# Patient Record
Sex: Female | Born: 1985 | Race: Black or African American | Hispanic: No | Marital: Single | Smoking: Former smoker
Health system: Southern US, Community
[De-identification: ages and names within clinical notes are randomized; demographics above are authoritative.]

## PROBLEM LIST (undated history)

## (undated) DIAGNOSIS — N83202 Unspecified ovarian cyst, left side: Secondary | ICD-10-CM

## (undated) DIAGNOSIS — Z8679 Personal history of other diseases of the circulatory system: Secondary | ICD-10-CM

## (undated) DIAGNOSIS — Z87442 Personal history of urinary calculi: Secondary | ICD-10-CM

## (undated) HISTORY — PX: ECTOPIC PREGNANCY SURGERY: SHX613

## (undated) HISTORY — PX: LASER ABLATION: SHX1947

---
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
## 2006-08-09 ENCOUNTER — Emergency Department: Payer: Self-pay | Admitting: Emergency Medicine

## 2008-11-09 ENCOUNTER — Emergency Department: Payer: Self-pay | Admitting: Emergency Medicine

## 2009-10-09 ENCOUNTER — Emergency Department: Payer: Self-pay | Admitting: Emergency Medicine

## 2009-11-13 ENCOUNTER — Ambulatory Visit: Payer: Self-pay | Admitting: General Practice

## 2009-12-11 ENCOUNTER — Ambulatory Visit: Payer: Self-pay | Admitting: Urology

## 2009-12-12 ENCOUNTER — Ambulatory Visit: Payer: Self-pay | Admitting: Urology

## 2009-12-21 ENCOUNTER — Ambulatory Visit: Payer: Self-pay | Admitting: Cardiovascular Disease

## 2010-03-26 ENCOUNTER — Emergency Department: Payer: Self-pay | Admitting: Unknown Physician Specialty

## 2010-09-18 ENCOUNTER — Emergency Department: Payer: Self-pay | Admitting: Emergency Medicine

## 2011-01-02 ENCOUNTER — Emergency Department: Payer: Self-pay | Admitting: *Deleted

## 2011-08-21 ENCOUNTER — Emergency Department: Payer: Self-pay | Admitting: Emergency Medicine

## 2011-08-21 LAB — COMPREHENSIVE METABOLIC PANEL
Alkaline Phosphatase: 46 U/L — ABNORMAL LOW (ref 50–136)
Anion Gap: 7 (ref 7–16)
BUN: 11 mg/dL (ref 7–18)
Bilirubin,Total: 0.4 mg/dL (ref 0.2–1.0)
Chloride: 111 mmol/L — ABNORMAL HIGH (ref 98–107)
Creatinine: 0.82 mg/dL (ref 0.60–1.30)
EGFR (African American): >60
Osmolality: 282 (ref 275–301)
Potassium: 4.3 mmol/L (ref 3.5–5.1)
SGPT (ALT): 134 U/L — ABNORMAL HIGH
Sodium: 142 mmol/L (ref 136–145)
Total Protein: 6.9 g/dL (ref 6.4–8.2)

## 2011-08-21 LAB — TROPONIN I
Troponin-I: <0.02 ng/mL
Troponin-I: <0.02 ng/mL

## 2011-08-21 LAB — PROTIME-INR
INR: 1
Prothrombin Time: 13.4 secs (ref 11.5–14.7)

## 2011-08-21 LAB — APTT: Activated PTT: 28.1 secs (ref 23.6–35.9)

## 2011-08-21 LAB — CBC
HGB: 13 g/dL (ref 12.0–16.0)
MCH: 30.8 pg (ref 26.0–34.0)
MCV: 93 fL (ref 80–100)
Platelet: 154 10*3/uL (ref 150–440)
WBC: 9.5 10*3/uL (ref 3.6–11.0)

## 2011-11-11 ENCOUNTER — Emergency Department: Payer: Self-pay | Admitting: Emergency Medicine

## 2012-05-19 IMAGING — CT CT STONE STUDY
1 of 2 series · 16 of 32 positions shown, 20 images · non-contrast
Comparison: none

REASON FOR EXAM: acute left flank pain with urine Unrulii
COMMENTS:   LMP: Two weeks ago

PROCEDURE:     CT  - CT ABDOMEN /PELVIS WO (STONE)  - October 09, 2009  [DATE]
RESULT:     History: Left flank pain.

[Series 2: stone · axial · 0.62mm/px · z∈[-494,-104]mm · 16 of 142 slices shown, 20 images]
[im 6/142  soft-tissue]
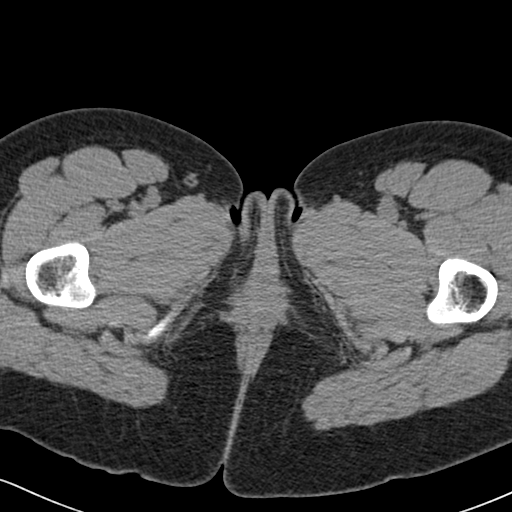
[im 6/142  bone]
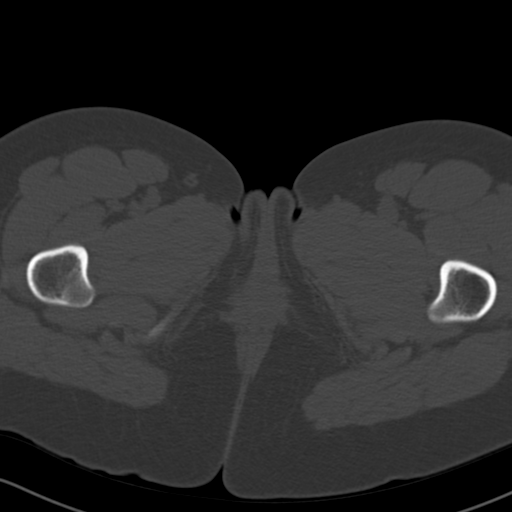
[im 16/142  soft-tissue]
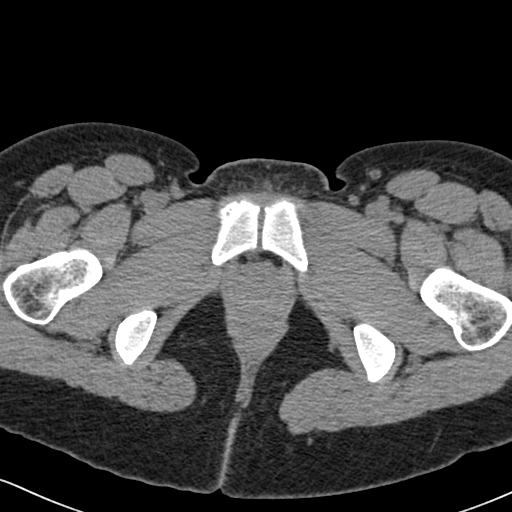
[im 27/142  soft-tissue]
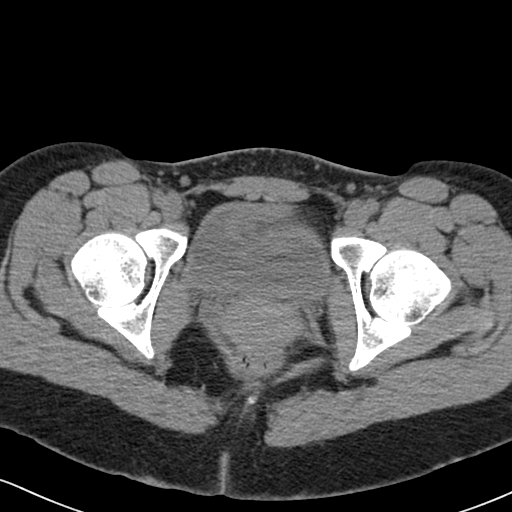
[im 37/142  soft-tissue]
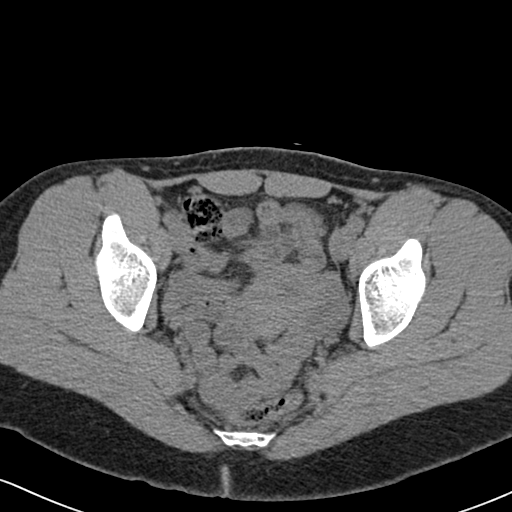
[im 48/142  soft-tissue]
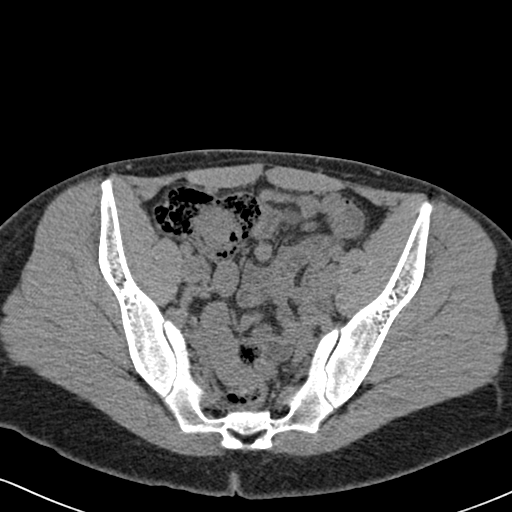
[im 58/142  soft-tissue]
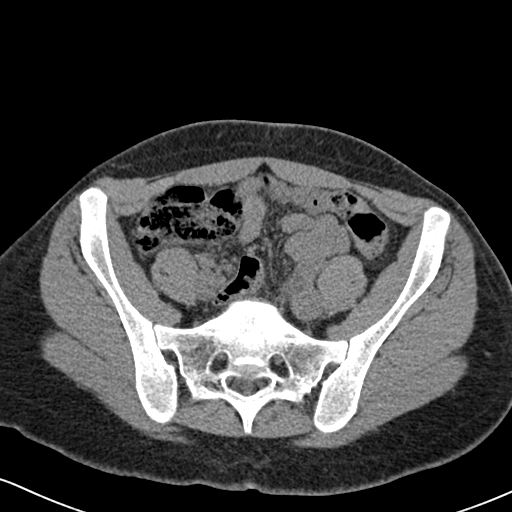
[im 68/142  soft-tissue]
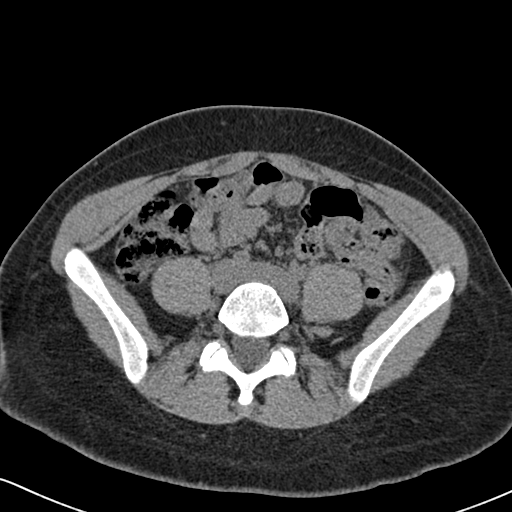
[im 74/142  soft-tissue]
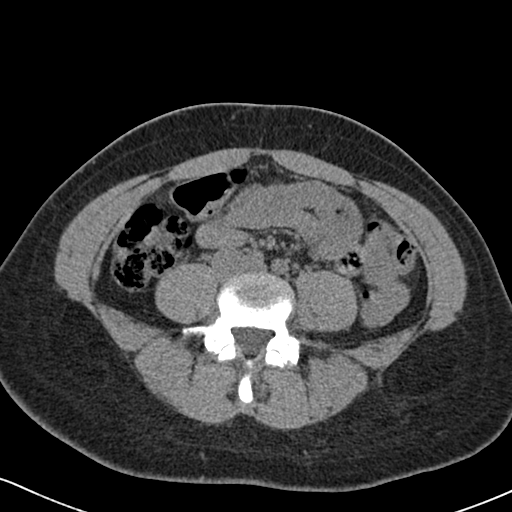
[im 84/142  soft-tissue]
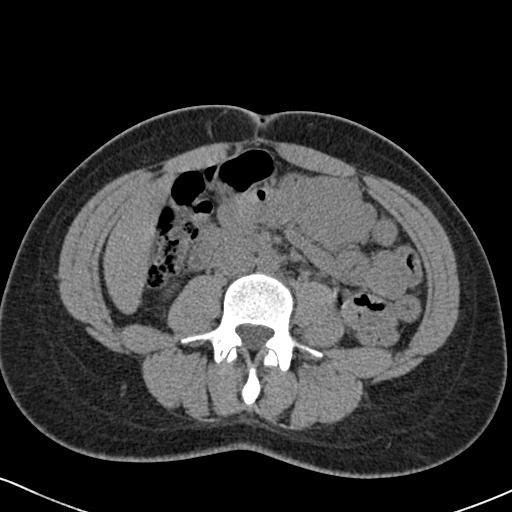
[im 84/142  bone]
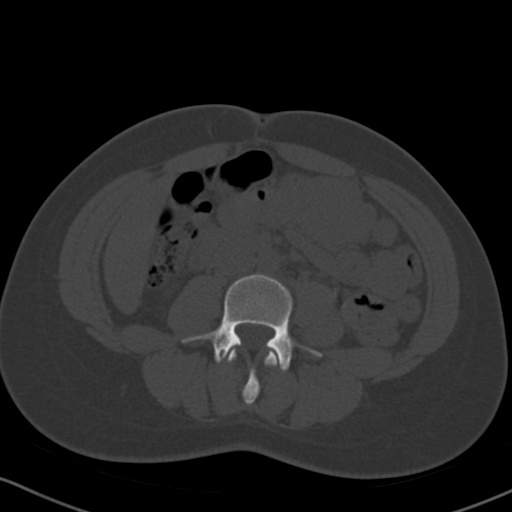
[im 95/142  soft-tissue]
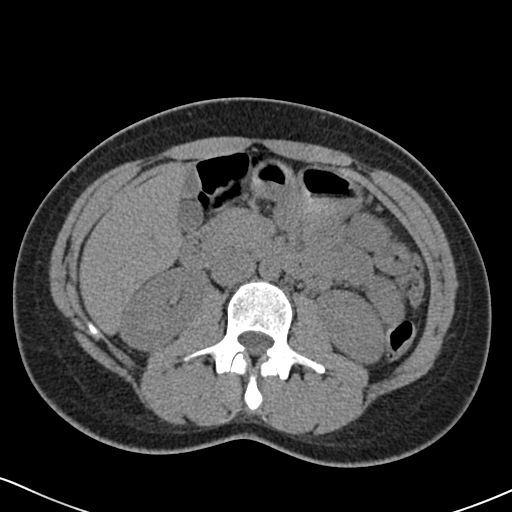
[im 105/142  soft-tissue]
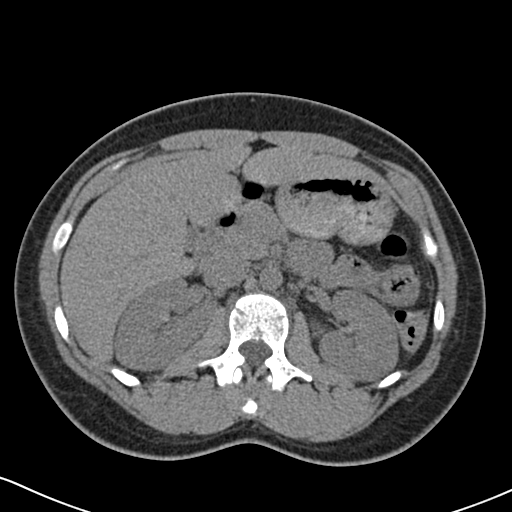
[im 115/142  soft-tissue]
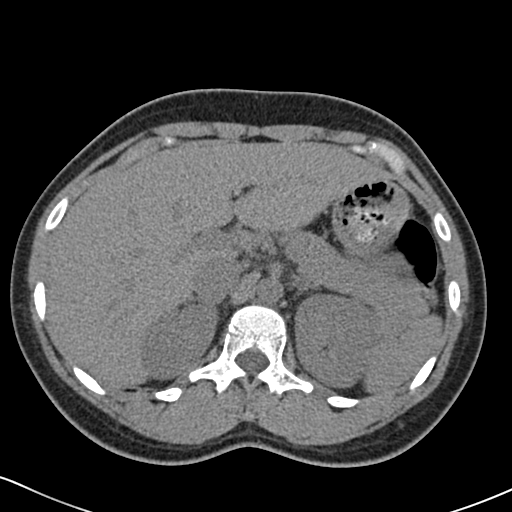
[im 121/142  lung]
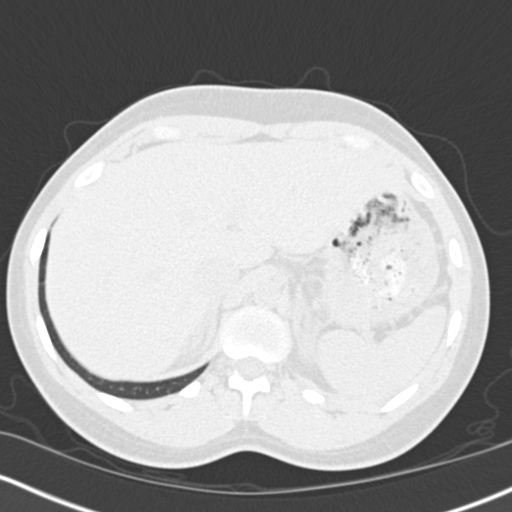
[im 126/142  soft-tissue]
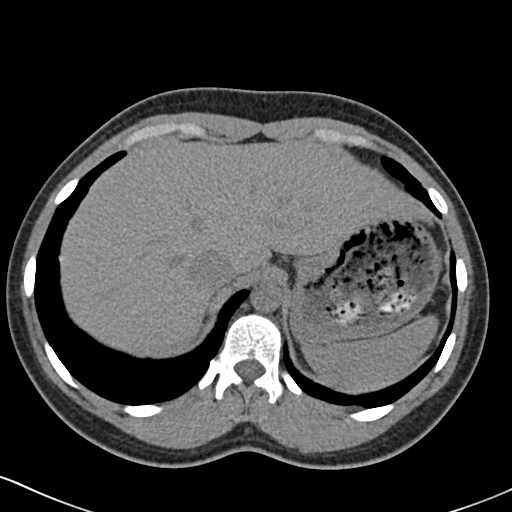
[im 126/142  lung]
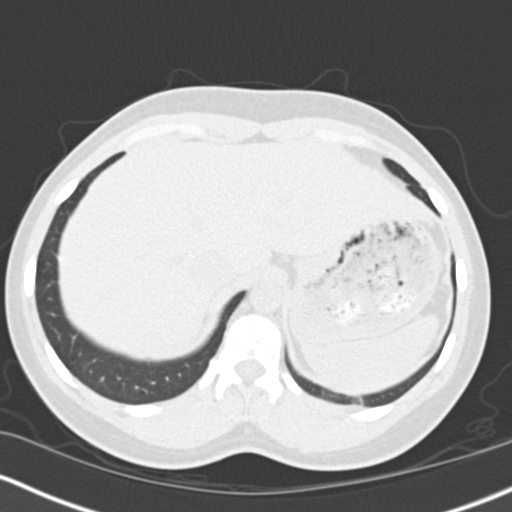
[im 131/142  lung]
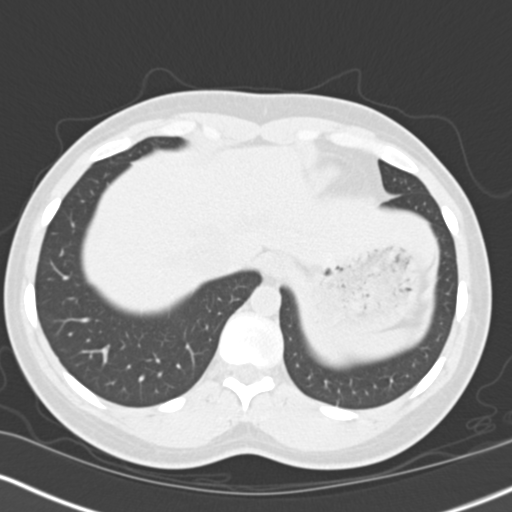
[im 136/142  soft-tissue]
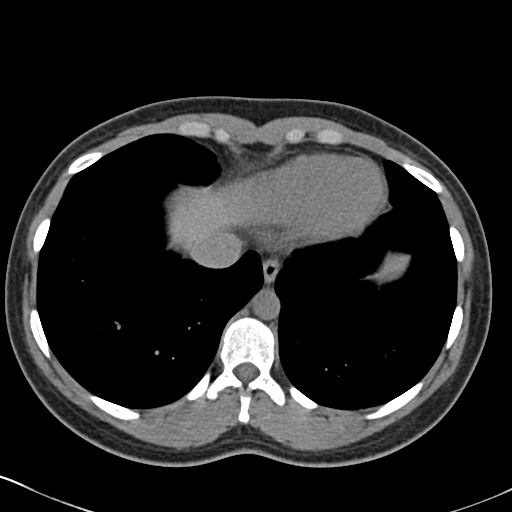
[im 136/142  lung]
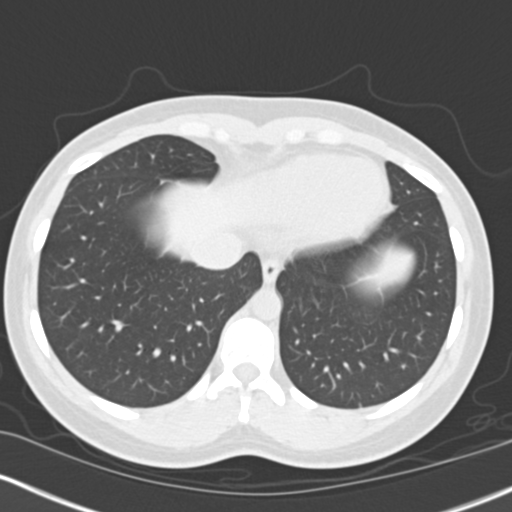

[16 of 32 positions shown; findings below may reference images not displayed]

FINDINGS: Standard CT obtained without contrast. No prior studies available
for comparison. Liver normal. Spleen normal. Adrenals normal. Kidneys are
normal. No hydronephrosis. Pancreas is normal. There is a 4 mm calcification
in the distal left ureter consistent with a small stone. Bladder is normal.
No pelvic fluid pathologic collections noted .No bowel distention or free
air. Lung bases are clear.
IMPRESSION: 4 mm distal left ureteral stone just above the
ureterovesical junction.

## 2012-08-10 ENCOUNTER — Emergency Department: Payer: Self-pay | Admitting: Emergency Medicine

## 2014-04-11 ENCOUNTER — Emergency Department: Payer: Self-pay | Admitting: Internal Medicine

## 2014-04-11 LAB — COMPREHENSIVE METABOLIC PANEL
ALT: 18 U/L
Albumin: 3.7 g/dL (ref 3.4–5.0)
Alkaline Phosphatase: 50 U/L
Anion Gap: 8 (ref 7–16)
BUN: 8 mg/dL (ref 7–18)
Bilirubin,Total: 0.5 mg/dL (ref 0.2–1.0)
CO2: 24 mmol/L (ref 21–32)
Calcium, Total: 8.8 mg/dL (ref 8.5–10.1)
Chloride: 107 mmol/L (ref 98–107)
Creatinine: 0.91 mg/dL (ref 0.60–1.30)
Glucose: 87 mg/dL (ref 65–99)
Osmolality: 275 (ref 275–301)
POTASSIUM: 3.6 mmol/L (ref 3.5–5.1)
SGOT(AST): 23 U/L (ref 15–37)
SODIUM: 139 mmol/L (ref 136–145)
Total Protein: 7.7 g/dL (ref 6.4–8.2)

## 2014-04-11 LAB — URINALYSIS, COMPLETE
Glucose,UR: NEGATIVE mg/dL (ref 0–75)
NITRITE: NEGATIVE
PH: 5 (ref 4.5–8.0)
Protein: 100
SPECIFIC GRAVITY: 1.035 (ref 1.003–1.030)
Squamous Epithelial: 20
WBC UR: 99 /HPF (ref 0–5)

## 2014-04-11 LAB — CBC WITH DIFFERENTIAL/PLATELET
BASOS PCT: 0.5 %
Basophil #: 0.1 10*3/uL (ref 0.0–0.1)
EOS PCT: 1.4 %
Eosinophil #: 0.2 10*3/uL (ref 0.0–0.7)
HCT: 42 % (ref 35.0–47.0)
HGB: 13.6 g/dL (ref 12.0–16.0)
LYMPHS PCT: 27.4 %
Lymphocyte #: 3.4 10*3/uL (ref 1.0–3.6)
MCH: 29.9 pg (ref 26.0–34.0)
MCHC: 32.5 g/dL (ref 32.0–36.0)
MCV: 92 fL (ref 80–100)
Monocyte #: 0.8 x10 3/mm (ref 0.2–0.9)
Monocyte %: 6.8 %
NEUTROS ABS: 7.9 10*3/uL — AB (ref 1.4–6.5)
Neutrophil %: 63.9 %
Platelet: 225 10*3/uL (ref 150–440)
RBC: 4.56 10*6/uL (ref 3.80–5.20)
RDW: 13.2 % (ref 11.5–14.5)
WBC: 12.3 10*3/uL — AB (ref 3.6–11.0)

## 2014-04-11 LAB — HCG, QUANTITATIVE, PREGNANCY: Beta Hcg, Quant.: <1 m[IU]/mL — ABNORMAL LOW

## 2014-04-11 LAB — LIPASE, BLOOD: Lipase: 95 U/L (ref 73–393)

## 2016-02-04 ENCOUNTER — Encounter: Payer: Self-pay | Admitting: Emergency Medicine

## 2016-02-04 ENCOUNTER — Emergency Department
Admission: EM | Admit: 2016-02-04 | Discharge: 2016-02-04 | Disposition: A | Discharge status: Home / Self Care | Payer: Self-pay | Attending: Emergency Medicine | Admitting: Emergency Medicine

## 2016-02-04 ENCOUNTER — Emergency Department: Payer: Self-pay

## 2016-02-04 ENCOUNTER — Other Ambulatory Visit: Payer: Self-pay

## 2016-02-04 DIAGNOSIS — R0602 Shortness of breath: Secondary | ICD-10-CM | POA: Insufficient documentation

## 2016-02-04 DIAGNOSIS — I4510 Unspecified right bundle-branch block: Secondary | ICD-10-CM | POA: Insufficient documentation

## 2016-02-04 DIAGNOSIS — R609 Edema, unspecified: Secondary | ICD-10-CM

## 2016-02-04 DIAGNOSIS — F17200 Nicotine dependence, unspecified, uncomplicated: Secondary | ICD-10-CM | POA: Insufficient documentation

## 2016-02-04 DIAGNOSIS — R531 Weakness: Secondary | ICD-10-CM

## 2016-02-04 LAB — TROPONIN I

## 2016-02-04 LAB — URINALYSIS COMPLETE WITH MICROSCOPIC (ARMC ONLY)
BACTERIA UA: NONE SEEN
BILIRUBIN URINE: NEGATIVE
GLUCOSE, UA: NEGATIVE mg/dL
HGB URINE DIPSTICK: NEGATIVE
Ketones, ur: NEGATIVE mg/dL
Leukocytes, UA: NEGATIVE
NITRITE: NEGATIVE
Protein, ur: NEGATIVE mg/dL
Specific Gravity, Urine: 1.017 (ref 1.005–1.030)
pH: 5 (ref 5.0–8.0)

## 2016-02-04 LAB — BASIC METABOLIC PANEL
Anion gap: 7 (ref 5–15)
BUN: 14 mg/dL (ref 6–20)
CALCIUM: 9.4 mg/dL (ref 8.9–10.3)
CHLORIDE: 106 mmol/L (ref 101–111)
CO2: 24 mmol/L (ref 22–32)
CREATININE: 1.06 mg/dL — AB (ref 0.44–1.00)
GFR calc non Af Amer: >60 mL/min (ref >60)
Glucose, Bld: 77 mg/dL (ref 65–99)
Potassium: 3.9 mmol/L (ref 3.5–5.1)
SODIUM: 137 mmol/L (ref 135–145)

## 2016-02-04 LAB — CBC
HCT: 40.6 % (ref 35.0–47.0)
Hemoglobin: 13.7 g/dL (ref 12.0–16.0)
MCH: 30.5 pg (ref 26.0–34.0)
MCHC: 33.7 g/dL (ref 32.0–36.0)
MCV: 90.7 fL (ref 80.0–100.0)
Platelets: 208 10*3/uL (ref 150–440)
RBC: 4.48 MIL/uL (ref 3.80–5.20)
RDW: 13.6 % (ref 11.5–14.5)
WBC: 10.1 10*3/uL (ref 3.6–11.0)

## 2016-02-04 LAB — TSH: TSH: 0.869 u[IU]/mL (ref 0.350–4.500)

## 2016-02-04 LAB — BRAIN NATRIURETIC PEPTIDE: B Natriuretic Peptide: 26 pg/mL (ref 0.0–100.0)

## 2016-02-04 LAB — POCT PREGNANCY, URINE: PREG TEST UR: NEGATIVE

## 2016-02-04 LAB — GLUCOSE, CAPILLARY: GLUCOSE-CAPILLARY: 88 mg/dL (ref 65–99)

## 2016-02-04 NOTE — ED Provider Notes (Addendum)
[facility] Emergency Department Provider Note  ____________________________________________   First MD Initiated Contact with Patient 02/04/16 1818     (approximate)  I have reviewed the triage vital signs and the nursing notes.   HISTORY  Chief Complaint Fatigue   HPI Sheila Kemp is a 30 y.o. female without any chronic medical problems was presenting to the emergency department today with 2 weeks of ongoing weakness. She is also complaining of swelling to her upper lip as well as bilateral hands and right foot this morning which has since resolved. She denies any pain. Denies any shortness of breath. Says that she has been getting her normal amount of sleep and does not have a good reason for her recent symptoms. She says that she does work third shift but she has been doing this for 8 or 9 months. Denies any fever. Denies any known sick contacts. No history of any rheumatologic diseases in her family such as rheumatoid arthritis or lupus that she knows about.   History reviewed. No pertinent past medical history.  There are no active problems to display for this patient.   Past Surgical History:  Procedure Laterality Date  . LASER ABLATION      Prior to Admission medications   Not on File    Allergies Patient has no known allergies.  History reviewed. No pertinent family history.  Social History Social History  Substance Use Topics  . Smoking status: Current Every Day Smoker  . Smokeless tobacco: Not on file  . Alcohol use Yes    Review of Systems Constitutional: No fever/chills Eyes: No visual changes. ENT: No sore throat. Cardiovascular: Denies chest pain. Respiratory: Denies shortness of breath. Gastrointestinal: No abdominal pain.  No nausea, no vomiting.  No diarrhea.  No constipation. Genitourinary: Negative for dysuria. Musculoskeletal: Negative for back pain. Skin: Negative for rash. Neurological: Negative for  headaches, focal weakness or numbness.  10-point ROS otherwise negative.  ____________________________________________   PHYSICAL EXAM:  VITAL SIGNS: ED Triage Vitals [02/04/16 1636]  Enc Vitals Group     BP 133/82     Pulse Rate 92     Resp 18     Temp 98.5 F (36.9 C)     Temp Source Oral     SpO2 100 %     Weight 235 lb (106.6 kg)     Height 5\' 10"  (1.778 m)     Head Circumference      Peak Flow      Pain Score      Pain Loc      Pain Edu?      Excl. in GC?     Constitutional: Alert and oriented. Well appearing and in no acute distress. Eyes: Conjunctivae are normal. PERRL. EOMI. Head: Atraumatic. Nose: No congestion/rhinnorhea. Mouth/Throat: Mucous membranes are moist.   Neck: No stridor.   Cardiovascular: Normal rate, regular rhythm. Grossly normal heart sounds.   Respiratory: Normal respiratory effort.  No retractions. Lungs CTAB. Gastrointestinal: Soft and nontender. No distention. No CVA tenderness. Musculoskeletal: No lower extremity tenderness .  Trace bilateral lower extremity edema.  No joint effusions. Neurologic:  Normal speech and language. No gross focal neurologic deficits are appreciated.  Skin:  Skin is warm, dry and intact. No rash noted. Psychiatric: Mood and affect are normal. Speech and behavior are normal.  ____________________________________________   LABS (all labs ordered are listed, but only abnormal results are displayed)  Labs Reviewed  BASIC METABOLIC PANEL - Abnormal; Notable  for the following:       Result Value   Creatinine, Ser 1.06 (*)    All other components within normal limits  URINALYSIS COMPLETEWITH MICROSCOPIC ([facility] ONLY) - Abnormal; Notable for the following:    Color, Urine YELLOW (*)    APPearance CLEAR (*)    Squamous Epithelial / LPF 0-5 (*)    All other components within normal limits  GLUCOSE, CAPILLARY  CBC  TROPONIN I  BRAIN NATRIURETIC PEPTIDE  TSH  POC URINE PREG, ED  POCT PREGNANCY, URINE    ____________________________________________  EKG  ED ECG REPORT I, Arelia LongestSchaevitz,  David M, the attending physician, personally viewed and interpreted this ECG.   Date: 02/04/2016  EKG Time: 1644  Rate: 85  Rhythm: normal sinus rhythm  Axis: Normal  Intervals:right bundle branch block  ST&T Change: No ST segment elevation or depression. No abnormal T-wave inversion. Similar morphology on EKG from 2013 but was an incomplete right bundle branch block at that time.  ____________________________________________  RADIOLOGY  DG Chest 2 View (Final result)  Result time 02/04/16 17:26:08  Final result by Ulyses SouthwardMark Boles, MD (02/04/16 17:26:08)           Narrative:   CLINICAL DATA: Shaky and tired for 2 weeks, shortness of breath when lying flat  EXAM: CHEST 2 VIEW  COMPARISON: 08/21/2011  FINDINGS: Normal heart size, mediastinal contours, and pulmonary vascularity.  Lungs clear.  No pleural effusion or pneumothorax.  Bones unremarkable.  IMPRESSION: Normal exam.   Electronically Signed By: Ulyses SouthwardMark Boles M.D. On: 02/04/2016 17:26          ____________________________________________   PROCEDURES  Procedure(s) performed:   Procedures  Critical Care performed:   ____________________________________________   INITIAL IMPRESSION / ASSESSMENT AND PLAN / ED COURSE  Pertinent labs & imaging results that were available during my care of the patient were reviewed by me and considered in my medical decision making (see chart for details).    Clinical Course    ----------------------------------------- 8:28 PM on 02/04/2016 -----------------------------------------  Patient resting comfortably at this time. Very reassuring lab workup. She does not have a primary care doctor does not have insurance. We discussed several clinics that may be good possibilities for including the [facility], [facility] and [facility] [place]  further workup including for conditions such as rheumatoid arthritis and lupus. She is understanding of this plan and willing to comply. She appears to have all of her symptoms resolve. No obvious cause for the swelling or fatigue over the past 2 weeks. She initially was concerned about diabetes but her normal glucose makes this very unlikely.  ____________________________________________   FINAL CLINICAL IMPRESSION(S) / ED DIAGNOSES  Weakness. Peripheral edema.    NEW MEDICATIONS STARTED DURING THIS VISIT:  New Prescriptions   No medications on file     Note:  This document was prepared using Dragon voice recognition software and may include unintentional dictation errors.    Myrna Blazeravid Matthew Schaevitz, MD 02/04/16 2029   I discussed the right bundle branch block with the patient as well as the cardiologist, Dr. Lady GaryFath, who agrees the patient should be appropriate follow-up with primary care.   Myrna Blazeravid Matthew Schaevitz, MD 02/04/16 2030

## 2016-02-04 NOTE — ED Triage Notes (Signed)
Has been feeling shaky and tired for about 2 weeks. Reports not diabetic. Has had some shortness of breath when laying flat. No distress in triage. Denies urinary sx. Reports hands were swollen this morning but they are better now.

## 2016-10-24 ENCOUNTER — Encounter: Payer: Self-pay | Admitting: *Deleted

## 2016-10-24 ENCOUNTER — Emergency Department
Admission: EM | Admit: 2016-10-24 | Discharge: 2016-10-24 | Disposition: A | Discharge status: Home / Self Care | Payer: Self-pay | Attending: Emergency Medicine | Admitting: Emergency Medicine

## 2016-10-24 DIAGNOSIS — M62830 Muscle spasm of back: Secondary | ICD-10-CM | POA: Insufficient documentation

## 2016-10-24 DIAGNOSIS — F17200 Nicotine dependence, unspecified, uncomplicated: Secondary | ICD-10-CM | POA: Insufficient documentation

## 2016-10-24 MED ORDER — NAPROXEN 500 MG PO TABS: 500.0000 mg | ORAL_TABLET | Freq: Two times a day (BID) | ORAL | 0 refills | Status: AC

## 2016-10-24 MED ORDER — CYCLOBENZAPRINE HCL 5 MG PO TABS: 5.0000 mg | ORAL_TABLET | Freq: Three times a day (TID) | ORAL | 0 refills | Status: DC | PRN

## 2016-10-24 MED ORDER — NAPROXEN 500 MG PO TABS
500.0000 mg | ORAL_TABLET | Freq: Once | ORAL | Status: AC
  Administered 2016-10-24: 500 mg via ORAL
  Filled 2016-10-24: qty 1

## 2016-10-24 MED ORDER — CYCLOBENZAPRINE HCL 10 MG PO TABS
5.0000 mg | ORAL_TABLET | Freq: Once | ORAL | Status: AC
  Administered 2016-10-24: 5 mg via ORAL
  Filled 2016-10-24: qty 1

## 2016-10-24 NOTE — ED Triage Notes (Signed)
Pt states left lower back pain, states pain increases with movement, denies any urinary symptoms, talking on cell phone during triage, ambulatory

## 2016-10-24 NOTE — ED Provider Notes (Signed)
[facility] Emergency Department Provider Note   ____________________________________________   I have reviewed the triage vital signs and the nursing notes.   HISTORY  Chief Complaint Back Pain    HPI Sheila Kemp is a 31 y.o. female presents emergency department with left sided lumbar pain that began when she attempted to get out of bed this morning. Patient describes pain as "sharp and grabbing" when she attempts to rotate or bend. Patient denies any traumatic injury or lifting injury to her knowledge. Patient reports pain only occurs when she moves in the wrong position or way. Patient denies any radicular symptoms, bowel or bladder dysfunction or saddle anesthesia. Patient also reports no past back injuries. Patient denies fever, chills, headache, vision changes, chest pain, chest tightness, shortness of breath, abdominal pain, nausea and vomiting.  History reviewed. No pertinent past medical history.  There are no active problems to display for this patient.   Past Surgical History:  Procedure Laterality Date  . LASER ABLATION      Prior to Admission medications   Medication Sig Start Date End Date Taking? Authorizing Provider  cyclobenzaprine (FLEXERIL) 5 MG tablet Take 1 tablet (5 mg total) by mouth 3 (three) times daily as needed. 10/24/16   Little, Traci M, PA-C  naproxen (NAPROSYN) 500 MG tablet Take 1 tablet (500 mg total) by mouth 2 (two) times daily with a meal. 10/24/16 10/24/17  Little, Traci M, PA-C    Allergies Patient has no known allergies.  History reviewed. No pertinent family history.  Social History Social History  Substance Use Topics  . Smoking status: Current Every Day Smoker  . Smokeless tobacco: Not on file  . Alcohol use Yes    Review of Systems Constitutional: Negative for fever/chills Eyes: No visual changes. ENT:  Negative for sore throat and for difficulty swallowing Cardiovascular: Denies chest  pain. Respiratory: Denies cough. Denies shortness of breath. Gastrointestinal: No abdominal pain.  No nausea, vomiting, diarrhea. Genitourinary: Negative for dysuria. Musculoskeletal: Positive for left side lumbar back pain. Skin: Negative for rash. Neurological: Negative for headaches.  Negative focal weakness or numbness. Negative for loss of consciousness. Able to ambulate. ____________________________________________   PHYSICAL EXAM:  VITAL SIGNS: Patient Vitals for the past 24 hrs:  BP Temp Temp src Pulse Resp SpO2 Height Weight  10/24/16 2005 118/67 98.3 F (36.8 C) Oral 71 18 97 % - -  10/24/16 1900 (!) 117/59 - - 77 18 99 % - -  10/24/16 1705 130/87 99 F (37.2 C) Oral 93 18 98 % 5\' 10"  (1.778 m) 111.1 kg (245 lb)    Constitutional: Alert and oriented. Well appearing and in no acute distress.  Eyes: Conjunctivae are normal. PERRL. EOMI  Head: Normocephalic and atraumatic. ENT:      Ears: Canals clear. TMs intact bilaterally.      Nose: No congestion/rhinnorhea.      Mouth/Throat: Mucous membranes are moist.  Neck:Supple. No thyromegaly. No stridor.  Cardiovascular: Normal rate, regular rhythm. Normal S1 and S2.  Good peripheral circulation. Respiratory: Normal respiratory effort without tachypnea or retractions. Lungs CTAB. Good air entry to the bases with no decreased or absent breath sounds. Hematological/Lymphatic/Immunological: No cervical lymphadenopathy. Cardiovascular: Normal rate, regular rhythm. Normal distal pulses. Respiratory: Normal respiratory effort. No wheezes/rales/rhonchi. Lungs CTAB with no W/R/R. Gastrointestinal: Bowel sounds 4 quadrants. Soft and nontender to palpation. No guarding or rigidity. No palpable masses. No distention. No CVA tenderness. Musculoskeletal: Left side lumbar back pain without radiculopathy and  without trauma. Palpable tenderness along lumbar paraspinals. Nontender with normal range of motion in all extremities. Neurologic:  Normal speech and language. No gross focal neurologic deficits are appreciated. No gait instability.No sensory loss or abnormal reflexes.  Skin:  Skin is warm, dry and intact. No rash noted. Psychiatric: Mood and affect are normal. Speech and behavior are normal. Patient exhibits appropriate insight and judgement.  ____________________________________________   LABS (all labs ordered are listed, but only abnormal results are displayed)  Labs Reviewed  POC URINE PREG, ED   ____________________________________________  EKG None ____________________________________________  RADIOLOGY None ____________________________________________   PROCEDURES  Procedure(s) performed: no    Critical Care performed: no ____________________________________________   INITIAL IMPRESSION / ASSESSMENT AND PLAN / ED COURSE  Pertinent labs & imaging results that were available during my care of the patient were reviewed by me and considered in my medical decision making (see chart for details).  Patient presents to emergency department with left-sided lumbar back pain. History and physical exam findings are reassuring symptoms are consistent with lumbar strain with muscle spasms. Patient noted improvement of symptoms following the percent and Flexeril during the course of care in the emergency department. Patient will be prescribed Naprosyn and Flexeril as needed for pain and muscle spasms. Patient advised to follow up with PCP as needed or return to the emergency department if symptoms return or worsen.  ____________________________________________   FINAL CLINICAL IMPRESSION(S) / ED DIAGNOSES  Final diagnoses:  Muscle spasm of back       NEW MEDICATIONS STARTED DURING THIS VISIT:  Discharge Medication List as of 10/24/2016  6:49 PM    START taking these medications   Details  cyclobenzaprine (FLEXERIL) 5 MG tablet Take 1 tablet (5 mg total) by mouth 3 (three) times daily as needed.,  Starting Fri 10/24/2016, Print    naproxen (NAPROSYN) 500 MG tablet Take 1 tablet (500 mg total) by mouth 2 (two) times daily with a meal., Starting Fri 10/24/2016, Until Sat 10/24/2017, Print         Note:  This document was prepared using Dragon voice recognition software and may include unintentional dictation errors.    Little, Jordan Likesraci M, PA-C 10/25/16 Corene Cornea0025    Goodman, Graydon, MD 10/25/16 [phone]

## 2016-10-27 LAB — POCT PREGNANCY, URINE: Preg Test, Ur: NEGATIVE

## 2018-04-30 ENCOUNTER — Emergency Department
Admission: EM | Admit: 2018-04-30 | Discharge: 2018-04-30 | Disposition: A | Discharge status: Home / Self Care | Payer: Self-pay | Attending: Emergency Medicine | Admitting: Emergency Medicine

## 2018-04-30 ENCOUNTER — Encounter: Payer: Self-pay | Admitting: Emergency Medicine

## 2018-04-30 DIAGNOSIS — J029 Acute pharyngitis, unspecified: Secondary | ICD-10-CM | POA: Insufficient documentation

## 2018-04-30 DIAGNOSIS — F17200 Nicotine dependence, unspecified, uncomplicated: Secondary | ICD-10-CM | POA: Insufficient documentation

## 2018-04-30 DIAGNOSIS — R509 Fever, unspecified: Secondary | ICD-10-CM | POA: Insufficient documentation

## 2018-04-30 LAB — GROUP A STREP BY PCR: Group A Strep by PCR: NOT DETECTED

## 2018-04-30 MED ORDER — PROMETHAZINE-DM 6.25-15 MG/5ML PO SYRP: 5.0000 mL | ORAL_SOLUTION | Freq: Four times a day (QID) | ORAL | 0 refills | Status: DC | PRN

## 2018-04-30 MED ORDER — LIDOCAINE VISCOUS HCL 2 % MT SOLN: 5.0000 mL | Freq: Four times a day (QID) | OROMUCOSAL | 0 refills | Status: DC | PRN

## 2018-04-30 NOTE — ED Provider Notes (Signed)
[facility] Emergency Department Provider Note   ____________________________________________   First MD Initiated Contact with Patient 04/30/18 1108     (approximate)  I have reviewed the triage vital signs and the nursing notes.   HISTORY  Chief Complaint Sore Throat and Fever    HPI Sheila Kemp is a 33 y.o. female patient complain of sore throat and fever since last night.  Patient states able to tolerate food and fluids.  Patient denies nausea, vomiting, diarrhea.  Patient denies nasal congestion or ear pressure pain.  Patient had taken flu shot for this season.  Patient rates the pain discomfort a 6/10.  No palliative measure for complaint.    History reviewed. No pertinent past medical history.  There are no active problems to display for this patient.   Past Surgical History:  Procedure Laterality Date  . LASER ABLATION      Prior to Admission medications   Medication Sig Start Date End Date Taking? Authorizing Provider  cyclobenzaprine (FLEXERIL) 5 MG tablet Take 1 tablet (5 mg total) by mouth 3 (three) times daily as needed. 10/24/16   Little, Traci M, PA-C  lidocaine (XYLOCAINE) 2 % solution Use as directed 5 mLs in the mouth or throat every 6 (six) hours as needed for mouth pain. Mix with Phenergan DM for swish and swallow. 04/30/18   Joni Reining, PA-C  promethazine-dextromethorphan (PROMETHAZINE-DM) 6.25-15 MG/5ML syrup Take 5 mLs by mouth 4 (four) times daily as needed for cough. Mix with viscous lidocaine for swish and swallow. 04/30/18   Joni Reining, PA-C    Allergies Patient has no known allergies.  No family history on file.  Social History Social History   Tobacco Use  . Smoking status: Current Every Day Smoker  Substance Use Topics  . Alcohol use: Yes  . Drug use: No    Review of Systems Constitutional: No fever/chills Eyes: No visual changes. ENT: Sore throat.   Cardiovascular: Denies chest  pain. Respiratory: Denies shortness of breath. Gastrointestinal: No abdominal pain.  No nausea, no vomiting.  No diarrhea.  No constipation. Genitourinary: Negative for dysuria. Musculoskeletal: Negative for back pain. Skin: Negative for rash. Neurological: Negative for headaches, focal weakness or numbness.   ____________________________________________   PHYSICAL EXAM:  VITAL SIGNS: ED Triage Vitals  Enc Vitals Group     BP 04/30/18 1102 127/81     Pulse Rate 04/30/18 1102 78     Resp 04/30/18 1102 20     Temp 04/30/18 1102 98.3 F (36.8 C)     Temp Source 04/30/18 1102 Oral     SpO2 04/30/18 1102 96 %     Weight 04/30/18 1103 245 lb (111.1 kg)     Height 04/30/18 1103 5\' 10"  (1.778 m)     Head Circumference --      Peak Flow --      Pain Score 04/30/18 1103 6     Pain Loc --      Pain Edu? --      Excl. in GC? --     Constitutional: Alert and oriented. Well appearing and in no acute distress. Nose: No congestion/rhinnorhea. Mouth/Throat: Mucous membranes are moist.  Oropharynx non-erythematous. Neck: No stridor.   Hematological/Lymphatic/Immunilogical: No cervical lymphadenopathy. Cardiovascular: Normal rate, regular rhythm. Grossly normal heart sounds.  Good peripheral circulation. Respiratory: Normal respiratory effort.  No retractions. Lungs CTAB. Neurologic:  Normal speech and language. No gross focal neurologic deficits are appreciated. No gait instability. Skin:  Skin is warm, dry and intact. No rash noted. Psychiatric: Mood and affect are normal. Speech and behavior are normal.  ____________________________________________   LABS (all labs ordered are listed, but only abnormal results are displayed)  Labs Reviewed  GROUP A STREP BY PCR   ____________________________________________  EKG   ____________________________________________  RADIOLOGY  ED MD interpretation:    Official radiology report(s): No results  found.  ____________________________________________   PROCEDURES  Procedure(s) performed: None  Procedures  Critical Care performed: No  ____________________________________________   INITIAL IMPRESSION / ASSESSMENT AND PLAN / ED COURSE  As part of my medical decision making, I reviewed the following data within the electronic MEDICAL RECORD NUMBER     Patient presents with fever and sore throat secondary to viral etiology.  Discussed negative strep results with patient.  Patient given discharge care instruction work note.  [facility] condition persist.      ____________________________________________   FINAL CLINICAL IMPRESSION(S) / ED DIAGNOSES  Final diagnoses:  Viral pharyngitis     ED Discharge Orders         Ordered    lidocaine (XYLOCAINE) 2 % solution  Every 6 hours PRN     04/30/18 1220    promethazine-dextromethorphan (PROMETHAZINE-DM) 6.25-15 MG/5ML syrup  4 times daily PRN     04/30/18 1220           Note:  This document was prepared using Dragon voice recognition software and may include unintentional dictation errors.    Joni Reining, PA-C 04/30/18 1221    Jene Every, MD 04/30/18 [phone]

## 2018-04-30 NOTE — ED Notes (Signed)
See triage note  Presents with sore throat and subjective fever  States her sx's started yesterday  Afebrile on arrival

## 2018-04-30 NOTE — ED Triage Notes (Signed)
Pt reports sore throat since yesterday and a fever last pm.  

## 2019-02-22 DIAGNOSIS — K8020 Calculus of gallbladder without cholecystitis without obstruction: Secondary | ICD-10-CM

## 2019-02-22 HISTORY — DX: Calculus of gallbladder without cholecystitis without obstruction: K80.20

## 2019-03-07 ENCOUNTER — Encounter: Payer: Self-pay | Admitting: Emergency Medicine

## 2019-03-07 ENCOUNTER — Emergency Department
Admission: EM | Admit: 2019-03-07 | Discharge: 2019-03-07 | Disposition: A | Discharge status: Home / Self Care | Payer: PRIVATE HEALTH INSURANCE | Attending: Emergency Medicine | Admitting: Emergency Medicine

## 2019-03-07 ENCOUNTER — Other Ambulatory Visit: Payer: Self-pay

## 2019-03-07 ENCOUNTER — Emergency Department: Payer: PRIVATE HEALTH INSURANCE

## 2019-03-07 DIAGNOSIS — F17210 Nicotine dependence, cigarettes, uncomplicated: Secondary | ICD-10-CM | POA: Diagnosis not present

## 2019-03-07 DIAGNOSIS — R1011 Right upper quadrant pain: Secondary | ICD-10-CM | POA: Diagnosis present

## 2019-03-07 DIAGNOSIS — K8050 Calculus of bile duct without cholangitis or cholecystitis without obstruction: Secondary | ICD-10-CM | POA: Diagnosis not present

## 2019-03-07 LAB — COMPREHENSIVE METABOLIC PANEL
ALT: 14 U/L (ref 0–44)
AST: 18 U/L (ref 15–41)
Albumin: 3.8 g/dL (ref 3.5–5.0)
Alkaline Phosphatase: 44 U/L (ref 38–126)
Anion gap: 8 (ref 5–15)
BUN: 11 mg/dL (ref 6–20)
CO2: 24 mmol/L (ref 22–32)
Calcium: 8.7 mg/dL — ABNORMAL LOW (ref 8.9–10.3)
Chloride: 105 mmol/L (ref 98–111)
Creatinine, Ser: 0.72 mg/dL (ref 0.44–1.00)
GFR calc Af Amer: >60 mL/min (ref >60)
GFR calc non Af Amer: >60 mL/min (ref >60)
Glucose, Bld: 106 mg/dL — ABNORMAL HIGH (ref 70–99)
Potassium: 3.8 mmol/L (ref 3.5–5.1)
Sodium: 137 mmol/L (ref 135–145)
Total Bilirubin: 0.6 mg/dL (ref 0.3–1.2)
Total Protein: 7.1 g/dL (ref 6.5–8.1)

## 2019-03-07 LAB — CBC
HCT: 38.8 % (ref 36.0–46.0)
Hemoglobin: 13 g/dL (ref 12.0–15.0)
MCH: 29.1 pg (ref 26.0–34.0)
MCHC: 33.5 g/dL (ref 30.0–36.0)
MCV: 87 fL (ref 80.0–100.0)
Platelets: 279 10*3/uL (ref 150–400)
RBC: 4.46 MIL/uL (ref 3.87–5.11)
RDW: 13.1 % (ref 11.5–15.5)
WBC: 12.7 10*3/uL — ABNORMAL HIGH (ref 4.0–10.5)
nRBC: 0 % (ref 0.0–0.2)

## 2019-03-07 LAB — URINALYSIS, COMPLETE (UACMP) WITH MICROSCOPIC
Bacteria, UA: NONE SEEN
Bilirubin Urine: NEGATIVE
Glucose, UA: NEGATIVE mg/dL
Hgb urine dipstick: NEGATIVE
Ketones, ur: NEGATIVE mg/dL
Nitrite: NEGATIVE
Protein, ur: NEGATIVE mg/dL
Specific Gravity, Urine: 1.025 (ref 1.005–1.030)
pH: 5 (ref 5.0–8.0)

## 2019-03-07 LAB — POCT PREGNANCY, URINE: Preg Test, Ur: NEGATIVE

## 2019-03-07 LAB — LIPASE, BLOOD: Lipase: 30 U/L (ref 11–51)

## 2019-03-07 MED ORDER — HYDROCODONE-ACETAMINOPHEN 5-325 MG PO TABS: 1.0000 | ORAL_TABLET | Freq: Four times a day (QID) | ORAL | 0 refills | Status: DC | PRN

## 2019-03-07 MED ORDER — ONDANSETRON 4 MG PO TBDP: 4.0000 mg | ORAL_TABLET | Freq: Three times a day (TID) | ORAL | 0 refills | Status: DC | PRN

## 2019-03-07 NOTE — ED Notes (Signed)
Patient transported to Ultrasound 

## 2019-03-07 NOTE — ED Triage Notes (Signed)
Patient with complaint of epigastric pain radiating to her back times three days after she eats. Patient states that tonight the pain will not go away. Patient denies nausea or vomiting.

## 2019-03-07 NOTE — ED Provider Notes (Signed)
[facility] Emergency Department Provider Note   ____________________________________________    I have reviewed the triage vital signs and the nursing notes.   HISTORY  Chief Complaint Abdominal Pain     HPI Sheila Kemp is a 33 y.o. female who presents with complaints of abdominal pain.  Patient reports yesterday she had significant abdominal pain is start after dinner which lasted for greater than 3 hours.  She reports she has been having this for the past 2 months however last night it lasted longer than typical.  Currently she feels well and has no complaints.  The pain is always in the right upper quadrant and radiates to her back.  Denies fevers or chills.  Positive nausea when she has this.  Has not take anything for it.  No other complaints.  History reviewed. No pertinent past medical history.  There are no problems to display for this patient.   Past Surgical History:  Procedure Laterality Date  . ECTOPIC PREGNANCY SURGERY    . LASER ABLATION      Prior to Admission medications   Medication Sig Start Date End Date Taking? Authorizing Provider  HYDROcodone-acetaminophen (NORCO/VICODIN) 5-325 MG tablet Take 1 tablet by mouth every 6 (six) hours as needed for moderate pain. 03/07/19   Jene Every, MD  ondansetron (ZOFRAN ODT) 4 MG disintegrating tablet Take 1 tablet (4 mg total) by mouth every 8 (eight) hours as needed. 03/07/19   Jene Every, MD     Allergies Patient has no known allergies.  No family history on file.  Social History Social History   Tobacco Use  . Smoking status: Current Every Day Smoker  . Smokeless tobacco: Never Used  Substance Use Topics  . Alcohol use: Yes  . Drug use: No    Review of Systems  Constitutional: No fever/chills Eyes: No visual changes.  ENT: No sore throat. Cardiovascular: Denies chest pain. Respiratory: Denies shortness of breath. Gastrointestinal: No abdominal pain.   No nausea, no vomiting.   Genitourinary: Negative for dysuria. Musculoskeletal: Negative for back pain. Skin: Negative for rash. Neurological: Negative for headaches or weakness   ____________________________________________   PHYSICAL EXAM:  VITAL SIGNS: ED Triage Vitals  Enc Vitals Group     BP 03/07/19 0242 (!) 143/83     Pulse Rate 03/07/19 0242 74     Resp 03/07/19 0242 18     Temp 03/07/19 0242 98.5 F (36.9 C)     Temp Source 03/07/19 0242 Oral     SpO2 03/07/19 0242 99 %     Weight 03/07/19 0240 111.1 kg (245 lb)     Height 03/07/19 0240 1.803 m (5\' 11" )     Head Circumference --      Peak Flow --      Pain Score 03/07/19 0240 10     Pain Loc --      Pain Edu? --      Excl. in GC? --     Constitutional: Alert and oriented. No acute distress. Pleasant and interactive Eyes: Conjunctivae are normal.    Mouth/Throat: Mucous membranes are moist.   Neck:  Painless ROM Cardiovascular: Normal rate, regular rhythm.   Good peripheral circulation. Respiratory: Normal respiratory effort.  No retractions.  Gastrointestinal: Soft and nontender. No distention.  No CVA tenderness.  Musculoskeletal: No lower extremity tenderness nor edema.  Warm and well perfused Neurologic:  Normal speech and language. No gross focal neurologic deficits are appreciated.  Skin:  Skin  is warm, dry and intact. No rash noted. Psychiatric: Mood and affect are normal. Speech and behavior are normal.  ____________________________________________   LABS (all labs ordered are listed, but only abnormal results are displayed)  Labs Reviewed  COMPREHENSIVE METABOLIC PANEL - Abnormal; Notable for the following components:      Result Value   Glucose, Bld 106 (*)    Calcium 8.7 (*)    All other components within normal limits  CBC - Abnormal; Notable for the following components:   WBC 12.7 (*)    All other components within normal limits  URINALYSIS, COMPLETE (UACMP) WITH MICROSCOPIC -  Abnormal; Notable for the following components:   Color, Urine YELLOW (*)    APPearance HAZY (*)    Leukocytes,Ua TRACE (*)    All other components within normal limits  LIPASE, BLOOD  POCT PREGNANCY, URINE  POC URINE PREG, ED   ____________________________________________  EKG  ED ECG REPORT I, Lavonia Drafts, the attending physician, personally viewed and interpreted this ECG.  Date: 03/07/2019  Rhythm: normal sinus rhythm QRS Axis: normal Intervals: Right bundle-branch block ST/T Wave abnormalities: normal Narrative Interpretation: no evidence of acute ischemia  ____________________________________________  RADIOLOGY  Ultrasound with gallstones, no evidence of cholecystitis ____________________________________________   PROCEDURES  Procedure(s) performed: No  Procedures   Critical Care performed: No ____________________________________________   INITIAL IMPRESSION / ASSESSMENT AND PLAN / ED COURSE  Pertinent labs & imaging results that were available during my care of the patient were reviewed by me and considered in my medical decision making (see chart for details).  Patient well-appearing at this time, lab work is reassuring, description of symptoms highly suspicious for biliary colic, ultrasound confirms gallstones however the patient is asymptomatic.  Lab work is reassuring.  Discussed with her the need for outpatient general surgery follow-up for likely cholecystectomy, analgesics prescribed, return precautions discussed    ____________________________________________   FINAL CLINICAL IMPRESSION(S) / ED DIAGNOSES  Final diagnoses:  RUQ pain  Biliary colic        Note:  This document was prepared using Dragon voice recognition software and may include unintentional dictation errors.   Lavonia Drafts, MD 03/07/19 [phone]

## 2019-03-08 ENCOUNTER — Ambulatory Visit (INDEPENDENT_AMBULATORY_CARE_PROVIDER_SITE_OTHER): Payer: PRIVATE HEALTH INSURANCE | Admitting: Surgery

## 2019-03-08 ENCOUNTER — Encounter: Payer: Self-pay | Admitting: Surgery

## 2019-03-08 VITALS — BP 117/76 | HR 82 | Temp 97.7°F | Ht 70.5 in | Wt 253.0 lb

## 2019-03-08 DIAGNOSIS — K8020 Calculus of gallbladder without cholecystitis without obstruction: Secondary | ICD-10-CM | POA: Diagnosis not present

## 2019-03-08 NOTE — Patient Instructions (Addendum)
You have requested to have your Gallbladder removed. We will arrange this to be done at [facility] with Dr Aleen Campi.   You will be off from work for approximately 1-2 weeks depending on your recovery.   Please avoid greasy and fried foods if at all possible prior to your scheduled surgery to decrease symptoms until then.  Please see the [facility]) pre-care form you have been given today.  If you have any questions or concerns please call our office.   Laparoscopic Cholecystectomy Laparoscopic cholecystectomy is surgery to remove the gallbladder. The gallbladder is located in the upper right part of the abdomen, behind the liver. It is a storage sac for bile, which is produced in the liver. Bile aids in the digestion and absorption of fats. Cholecystectomy is often done for inflammation of the gallbladder (cholecystitis). This condition is usually caused by a buildup of gallstones (cholelithiasis) in the gallbladder. Gallstones can block the flow of bile, and that can result in inflammation and pain. In severe cases, emergency surgery may be required. If emergency surgery is not required, you will have time to prepare for the procedure. Laparoscopic surgery is an alternative to open surgery. Laparoscopic surgery has a shorter recovery time. Your common bile duct may also need to be examined during the procedure. If stones are found in the common bile duct, they may be removed. LET [facility] CARE PROVIDER KNOW ABOUT:       Any allergies you have.  All medicines you are taking, including vitamins, herbs, eye drops, creams, and over-the-counter medicines.  Previous problems you or members of your family have had with the use of anesthetics.  Any blood disorders you have.  Previous surgeries you have had.    Any medical conditions you have. RISKS AND COMPLICATIONS Generally, this is a safe procedure. However, problems may occur, including:  Infection.  Bleeding.  Allergic reactions  to medicines.  Damage to other structures or organs.  A stone remaining in the common bile duct.  A bile leak from the cyst duct that is clipped when your gallbladder is removed.  The need to convert to open surgery, which requires a larger incision in the abdomen. This may be necessary if your surgeon thinks that it is not safe to continue with a laparoscopic procedure. BEFORE THE PROCEDURE  Ask your health care provider about:  Changing or stopping your regular medicines. This is especially important if you are taking diabetes medicines or blood thinners.  Taking medicines such as aspirin and ibuprofen. These medicines can thin your blood. Do not take these medicines before your procedure if your health care provider instructs you not to.  Follow instructions from your health care provider about eating or drinking restrictions.  Let your health care provider know if you develop a cold or an infection before surgery.  Plan to have someone take you home after the procedure.  Ask your health care provider how your surgical site will be marked or identified.  You may be given antibiotic medicine to help prevent infection. PROCEDURE  To reduce your risk of infection:  Your health care team will wash or sanitize their hands.  Your skin will be washed with soap.  An IV tube may be inserted into one of your veins.  You will be given a medicine to make you fall asleep (general anesthetic).  A breathing tube will be placed in your mouth.  The surgeon will make several small cuts (incisions) in your abdomen.  A thin,  lighted tube (laparoscope) that has a tiny camera on the end will be inserted through one of the small incisions. The camera on the laparoscope will send a picture to a TV screen (monitor) in the operating room. This will give the surgeon a good view inside your abdomen.  A gas will be pumped into your abdomen. This will expand your abdomen to give the surgeon more  room to perform the surgery.  Other tools that are needed for the procedure will be inserted through the other incisions. The gallbladder will be removed through one of the incisions.  After your gallbladder has been removed, the incisions will be closed with stitches (sutures), staples, or skin glue.  Your incisions may be covered with a bandage (dressing). The procedure may vary among health care providers and hospitals. AFTER THE PROCEDURE  Your blood pressure, heart rate, breathing rate, and blood oxygen level will be monitored often until the medicines you were given have worn off.  You will be given medicines as needed to control your pain.   This information is not intended to replace advice given to you by your health care provider. Make sure you discuss any questions you have with your health care provider.   Document Released: 03/10/2005 Document Revised: 11/29/2014 Document Reviewed: 10/20/2012 Elsevier Interactive Patient Education 2016 Elsevier Inc.   Low-Fat Diet for Gallbladder Conditions A low-fat diet can be helpful if you have pancreatitis or a gallbladder condition. With these conditions, your pancreas and gallbladder have trouble digesting fats. A healthy eating plan with less fat will help rest your pancreas and gallbladder and reduce your symptoms. WHAT DO I NEED TO KNOW ABOUT THIS DIET?  Eat a low-fat diet.  Reduce your fat intake to less than 20-30% of your total daily calories. This is less than 50-60 g of fat per day.  Remember that you need some fat in your diet. Ask your dietician what your daily goal should be.  Choose nonfat and low-fat healthy foods. Look for the words "nonfat," "low fat," or "fat free."  As a guide, look on the label and choose foods with less than 3 g of fat per serving. Eat only one serving.  Avoid alcohol.  Do not smoke. If you need help quitting, talk with your health care provider.  Eat small frequent meals instead of three  large heavy meals. WHAT FOODS CAN I EAT? Grains Include healthy grains and starches such as potatoes, wheat bread, fiber-rich cereal, and brown rice. Choose whole grain options whenever possible. In adults, whole grains should account for 45-65% of your daily calories.  Fruits and Vegetables Eat plenty of fruits and vegetables. Fresh fruits and vegetables add fiber to your diet. Meats and Other Protein Sources Eat lean meat such as chicken and pork. Trim any fat off of meat before cooking it. Eggs, fish, and beans are other sources of protein. In adults, these foods should account for 10-35% of your daily calories. Dairy Choose low-fat milk and dairy options. Dairy includes fat and protein, as well as calcium.  Fats and Oils Limit high-fat foods such as fried foods, sweets, baked goods, sugary drinks.  Other Creamy sauces and condiments, such as mayonnaise, can add extra fat. Think about whether or not you need to use them, or use smaller amounts or low fat options. WHAT FOODS ARE NOT RECOMMENDED?  High fat foods, such as:  [employer] Corporation[employer]aked goods.  Ice cream.  [place] toast.  Sweet rolls.  Pizza.  Cheese bread.  Foods  covered with batter, butter, creamy sauces, or cheese.  Fried foods.  Sugary drinks and desserts.  Foods that cause gas or bloating   This information is not intended to replace advice given to you by your health care provider. Make sure you discuss any questions you have with your health care provider.   Document Released: 03/15/2013 Document Reviewed: 03/15/2013 Elsevier Interactive Patient Education [employer].   Cholelithiasis  Cholelithiasis is also called "gallstones." It is a kind of gallbladder disease. The gallbladder is an organ that stores a liquid (bile) that helps you digest fat. Gallstones may not cause symptoms (may be silent gallstones) until they cause a blockage, and then they can cause pain (gallbladder attack). Follow these instructions at  home:  Take over-the-counter and prescription medicines only as told by your doctor.  Stay at a healthy weight.  Eat healthy foods. This includes: ? Eating fewer fatty foods, like fried foods. ? Eating fewer refined carbs (refined carbohydrates). Refined carbs are breads and grains that are highly processed, like white bread and white rice. Instead, choose whole grains like whole-wheat bread and brown rice. ? Eating more fiber. Almonds, fresh fruit, and beans are healthy sources of fiber.  Keep all follow-up visits as told by your doctor. This is important. Contact a doctor if:  You have sudden pain in the upper right side of your belly (abdomen). Pain might spread to your right shoulder or your chest. This may be a sign of a gallbladder attack.  You feel sick to your stomach (are nauseous).  You throw up (vomit).  You have been diagnosed with gallstones that have no symptoms and you get: ? Belly pain. ? Discomfort, burning, or fullness in the upper part of your belly (indigestion). Get help right away if:  You have sudden pain in the upper right side of your belly, and it lasts for more than 2 hours.  You have belly pain that lasts for more than 5 hours.  You have a fever or chills.  You keep feeling sick to your stomach or you keep throwing up.  Your skin or the whites of your eyes turn yellow (jaundice).  You have dark-colored pee (urine).  You have light-colored poop (stool). Summary  Cholelithiasis is also called "gallstones."  The gallbladder is an organ that stores a liquid (bile) that helps you digest fat.  Silent gallstones are gallstones that do not cause symptoms.  A gallbladder attack may cause sudden pain in the upper right side of your belly. Pain might spread to your right shoulder or your chest. If this happens, contact your doctor.  If you have sudden pain in the upper right side of your belly that lasts for more than 2 hours, get help right  away. This information is not intended to replace advice given to you by your health care provider. Make sure you discuss any questions you have with your health care provider. Document Released: 08/27/2007 Document Revised: 02/20/2017 Document Reviewed: 11/25/2015 Elsevier Patient Education  2020 [employer].

## 2019-03-08 NOTE — Progress Notes (Signed)
03/08/2019  Reason for Visit:  Symptomatic cholelithiasis  History of Present Illness: Sheila Kemp is a 33 y.o. female presenting for evaluation of symptomatic cholelithiasis.  She presented to the ED yesterday with a 3 day history of abdominal pain in the epigastric and right upper quadrant areas.  She reports she has had about two other episodes over the past two months, but they were less severe.  This last episode was worse.  She had eaten something greasy and the pain started at night on 12/11.  It somewhat got better but then persisted again through the weekend.  She felt like she had to vomit, but she had to gag herself to do so.  The pain would radiate to her back as well.  Denies any fevers, chills, chest pain, shortness of breath, constipation, or diarrhea.  No other areas of pain.  She presented to the ED on 12/14.  She had labs which showed a mildly elevated WBC to 12.7, but normal LFTs.  She also had an ultrasound which showed cholelithiasis, with a stone in the neck of the gallbladder, but no gallbladder wall thickening or pericholecystic fluid.  Her pain resolved in the ED and she was discharged home.  Past Medical History: History reviewed. No pertinent past medical history.   Past Surgical History: Past Surgical History:  Procedure Laterality Date  . ECTOPIC PREGNANCY SURGERY    . LASER ABLATION      Home Medications: Prior to Admission medications   Medication Sig Start Date End Date Taking? Authorizing Provider  HYDROcodone-acetaminophen (NORCO/VICODIN) 5-325 MG tablet Take 1 tablet by mouth every 6 (six) hours as needed for moderate pain. 03/07/19  Yes Jene Every, MD  ondansetron (ZOFRAN ODT) 4 MG disintegrating tablet Take 1 tablet (4 mg total) by mouth every 8 (eight) hours as needed. 03/07/19  Yes Jene Every, MD    Allergies: No Known Allergies  Social History:  reports that she has been smoking. She has never used smokeless tobacco. She reports  current alcohol use. She reports that she does not use drugs.   Family History: History reviewed. No pertinent family history.  Review of Systems: Review of Systems  Constitutional: Negative for chills and fever.  HENT: Negative for hearing loss.   Eyes: Negative for blurred vision.  Respiratory: Negative for shortness of breath.   Cardiovascular: Negative for chest pain.  Gastrointestinal: Positive for abdominal pain, nausea and vomiting. Negative for constipation and diarrhea.  Genitourinary: Negative for dysuria.  Musculoskeletal: Negative for myalgias.  Skin: Negative for rash.  Neurological: Negative for dizziness.  Psychiatric/Behavioral: Negative for depression.    Physical Exam BP 117/76   Pulse 82   Temp 97.7 F (36.5 C)   Ht 5' 10.5" (1.791 m)   Wt 253 lb (114.8 kg)   LMP 03/02/2019 (Exact Date)   SpO2 97%   BMI 35.79 kg/m  CONSTITUTIONAL: No acute distress HEENT:  Normocephalic, atraumatic, extraocular motion intact. NECK: Trachea is midline, and there is no jugular venous distension.  RESPIRATORY:  Lungs are clear, and breath sounds are equal bilaterally. Normal respiratory effort without pathologic use of accessory muscles. CARDIOVASCULAR: Heart is regular without murmurs, gallops, or rubs. GI: The abdomen is soft, non-distended, currently non-tender to palpation.  Negative Murphy's sign. MUSCULOSKELETAL:  Normal muscle strength and tone in all four extremities.  No peripheral edema or cyanosis. SKIN: Skin turgor is normal. There are no pathologic skin lesions.  NEUROLOGIC:  Motor and sensation is grossly normal.  Cranial nerves  are grossly intact. PSYCH:  Alert and oriented to person, place and time. Affect is normal.  Laboratory Analysis: Labs 03/07/19: Na 137, K 3.8, Cl 105, CO2 24, BUN 11, Cr 0.72.  LFTs within normal.  WBC 12.7, Hgb 13, Hct 38.8, Plt 279  Imaging: U/S Abdomen 12/14: IMPRESSION: 1. Cholelithiasis without sonographic evidence for  acute cholecystitis. 2. There is a 1.4 cm gallstone wedged at the gallbladder neck.   Assessment and Plan: This is a 33 y.o. female with symptomatic cholelithiasis.  --Discussed with the patient part of the pathophysiology behind biliary colic and some of the possible treatment options.  Discussed with her the role for conservative management with a low fat diet.  There is however, no guarantee that this alone would help to keep her asymptomatic.  The surgical option would be in the form of cholecystectomy.  Discussed with her the procedure in length.  She understands that at this point, we do have flexibility with timing and also with treatment options.  After further thought, she has opted for cholecystectomy.  Discussed with her the role for laparoscopic cholecystectomy, particularly risks of bleeding, infection, and injury to surrounding structures.  She's willing to proceed.  She would like to wait until after the holidays to do her surgery, so we will schedule her for 03/29/19.  She understands that she would need to get COVID tested and that if positive, it would delay her surgery.  She is willing to proceed.  All of her questions have been answered.  Face-to-face time spent with the patient and care providers was 45 minutes, with more than 50% of the time spent counseling, educating, and coordinating care of the patient.     Melvyn Neth, [facility]

## 2019-03-10 ENCOUNTER — Telehealth: Payer: Self-pay | Admitting: Surgery

## 2019-03-10 NOTE — Telephone Encounter (Signed)
Pt has been advised of pre admission date/time, Covid Testing date and Surgery date.  Surgery Date: 03/29/19 with Dr Audry Riles cholecystectomy.  Preadmission Testing Date: 03/23/19 between 8-1:00pm-phone interview.  Covid Testing Date: 03/24/19 between 8-10:30am - patient advised to go to the [place] ([place])  [employer] Video sent via [employer] [facility] and [employer].  Patient has been made aware to call [phone], between 1-3:00pm the day before surgery, to find out what time to arrive.

## 2019-03-21 ENCOUNTER — Inpatient Hospital Stay
Admission: EM | Admit: 2019-03-21 | Discharge: 2019-03-23 | DRG: 419 | Disposition: A | Discharge status: Home / Self Care | Payer: PRIVATE HEALTH INSURANCE | Attending: Surgery | Admitting: Surgery

## 2019-03-21 ENCOUNTER — Emergency Department: Payer: PRIVATE HEALTH INSURANCE

## 2019-03-21 ENCOUNTER — Encounter: Payer: Self-pay | Admitting: Medical Oncology

## 2019-03-21 ENCOUNTER — Other Ambulatory Visit: Payer: Self-pay

## 2019-03-21 DIAGNOSIS — Z20828 Contact with and (suspected) exposure to other viral communicable diseases: Secondary | ICD-10-CM | POA: Diagnosis present

## 2019-03-21 DIAGNOSIS — K810 Acute cholecystitis: Secondary | ICD-10-CM | POA: Diagnosis present

## 2019-03-21 DIAGNOSIS — F17200 Nicotine dependence, unspecified, uncomplicated: Secondary | ICD-10-CM | POA: Diagnosis present

## 2019-03-21 DIAGNOSIS — K8020 Calculus of gallbladder without cholecystitis without obstruction: Secondary | ICD-10-CM

## 2019-03-21 DIAGNOSIS — R1010 Upper abdominal pain, unspecified: Secondary | ICD-10-CM

## 2019-03-21 DIAGNOSIS — K8000 Calculus of gallbladder with acute cholecystitis without obstruction: Principal | ICD-10-CM | POA: Diagnosis present

## 2019-03-21 LAB — COMPREHENSIVE METABOLIC PANEL
ALT: 51 U/L — ABNORMAL HIGH (ref 0–44)
AST: 35 U/L (ref 15–41)
Albumin: 3.7 g/dL (ref 3.5–5.0)
Alkaline Phosphatase: 41 U/L (ref 38–126)
Anion gap: 11 (ref 5–15)
BUN: 14 mg/dL (ref 6–20)
CO2: 19 mmol/L — ABNORMAL LOW (ref 22–32)
Calcium: 8.7 mg/dL — ABNORMAL LOW (ref 8.9–10.3)
Chloride: 106 mmol/L (ref 98–111)
Creatinine, Ser: 0.8 mg/dL (ref 0.44–1.00)
GFR calc Af Amer: >60 mL/min (ref >60)
GFR calc non Af Amer: >60 mL/min (ref >60)
Glucose, Bld: 133 mg/dL — ABNORMAL HIGH (ref 70–99)
Potassium: 3.7 mmol/L (ref 3.5–5.1)
Sodium: 136 mmol/L (ref 135–145)
Total Bilirubin: 0.5 mg/dL (ref 0.3–1.2)
Total Protein: 7.1 g/dL (ref 6.5–8.1)

## 2019-03-21 LAB — URINALYSIS, COMPLETE (UACMP) WITH MICROSCOPIC
Bilirubin Urine: NEGATIVE
Glucose, UA: NEGATIVE mg/dL
Hgb urine dipstick: NEGATIVE
Ketones, ur: NEGATIVE mg/dL
Nitrite: NEGATIVE
Protein, ur: NEGATIVE mg/dL
Specific Gravity, Urine: 1.031 — ABNORMAL HIGH (ref 1.005–1.030)
pH: 5 (ref 5.0–8.0)

## 2019-03-21 LAB — CBC
HCT: 39.9 % (ref 36.0–46.0)
Hemoglobin: 12.9 g/dL (ref 12.0–15.0)
MCH: 29.3 pg (ref 26.0–34.0)
MCHC: 32.3 g/dL (ref 30.0–36.0)
MCV: 90.7 fL (ref 80.0–100.0)
Platelets: 250 10*3/uL (ref 150–400)
RBC: 4.4 MIL/uL (ref 3.87–5.11)
RDW: 13.1 % (ref 11.5–15.5)
WBC: 11.7 10*3/uL — ABNORMAL HIGH (ref 4.0–10.5)
nRBC: 0 % (ref 0.0–0.2)

## 2019-03-21 LAB — HIV ANTIBODY (ROUTINE TESTING W REFLEX): HIV Screen 4th Generation wRfx: NONREACTIVE

## 2019-03-21 LAB — LIPASE, BLOOD: Lipase: 25 U/L (ref 11–51)

## 2019-03-21 MED ORDER — MORPHINE SULFATE (PF) 4 MG/ML IV SOLN
4.0000 mg | Freq: Once | INTRAVENOUS | Status: AC
  Administered 2019-03-21: 4 mg via INTRAVENOUS
  Filled 2019-03-21: qty 1

## 2019-03-21 MED ORDER — SODIUM CHLORIDE 0.9% FLUSH: 3.0000 mL | Freq: Once | INTRAVENOUS | Status: DC

## 2019-03-21 MED ORDER — KETOROLAC TROMETHAMINE 30 MG/ML IJ SOLN
30.0000 mg | Freq: Four times a day (QID) | INTRAMUSCULAR | Status: DC
  Administered 2019-03-21 – 2019-03-23 (×7): 30 mg via INTRAVENOUS
  Filled 2019-03-21 (×7): qty 1

## 2019-03-21 MED ORDER — OXYCODONE-ACETAMINOPHEN 5-325 MG PO TABS
1.0000 | ORAL_TABLET | ORAL | Status: DC | PRN
  Administered 2019-03-21: 1 via ORAL
  Filled 2019-03-21: qty 1

## 2019-03-21 MED ORDER — PIPERACILLIN-TAZOBACTAM 3.375 G IVPB
3.3750 g | Freq: Three times a day (TID) | INTRAVENOUS | Status: DC
  Administered 2019-03-21 – 2019-03-23 (×6): 3.375 g via INTRAVENOUS
  Filled 2019-03-21 (×9): qty 50

## 2019-03-21 MED ORDER — ONDANSETRON 4 MG PO TBDP: 4.0000 mg | ORAL_TABLET | Freq: Four times a day (QID) | ORAL | Status: DC | PRN

## 2019-03-21 MED ORDER — KETOROLAC TROMETHAMINE 30 MG/ML IJ SOLN
30.0000 mg | Freq: Once | INTRAMUSCULAR | Status: AC
  Administered 2019-03-21: 30 mg via INTRAVENOUS

## 2019-03-21 MED ORDER — ONDANSETRON 4 MG PO TBDP
4.0000 mg | ORAL_TABLET | Freq: Once | ORAL | Status: AC | PRN
  Administered 2019-03-21: 4 mg via ORAL
  Filled 2019-03-21: qty 1

## 2019-03-21 MED ORDER — LACTATED RINGERS IV SOLN
125.0000 mL/h | INTRAVENOUS | Status: DC
  Administered 2019-03-21 – 2019-03-22 (×2): 125 mL/h via INTRAVENOUS

## 2019-03-21 MED ORDER — FENTANYL CITRATE (PF) 100 MCG/2ML IJ SOLN
50.0000 ug | Freq: Once | INTRAMUSCULAR | Status: AC
  Administered 2019-03-21: 50 ug via INTRAVENOUS
  Filled 2019-03-21: qty 2

## 2019-03-21 MED ORDER — HYDROMORPHONE HCL 1 MG/ML IJ SOLN
0.5000 mg | INTRAMUSCULAR | Status: DC | PRN
  Administered 2019-03-21: 0.5 mg via INTRAVENOUS
  Filled 2019-03-21: qty 1

## 2019-03-21 MED ORDER — ONDANSETRON HCL 4 MG/2ML IJ SOLN: 4.0000 mg | Freq: Four times a day (QID) | INTRAMUSCULAR | Status: DC | PRN

## 2019-03-21 MED ORDER — ACETAMINOPHEN 500 MG PO TABS
1000.0000 mg | ORAL_TABLET | Freq: Four times a day (QID) | ORAL | Status: DC
  Administered 2019-03-21 – 2019-03-23 (×6): 1000 mg via ORAL
  Filled 2019-03-21 (×7): qty 2

## 2019-03-21 MED ORDER — PANTOPRAZOLE SODIUM 40 MG IV SOLR
40.0000 mg | Freq: Every day | INTRAVENOUS | Status: DC
  Administered 2019-03-21 – 2019-03-22 (×2): 40 mg via INTRAVENOUS
  Filled 2019-03-21 (×4): qty 40

## 2019-03-21 MED ORDER — ONDANSETRON HCL 4 MG/2ML IJ SOLN
INTRAMUSCULAR | Status: AC
  Administered 2019-03-21: 4 mg via INTRAVENOUS
  Filled 2019-03-21: qty 2

## 2019-03-21 MED ORDER — ENOXAPARIN SODIUM 40 MG/0.4ML ~~LOC~~ SOLN
40.0000 mg | SUBCUTANEOUS | Status: DC
  Administered 2019-03-21 – 2019-03-22 (×2): 40 mg via SUBCUTANEOUS
  Filled 2019-03-21 (×2): qty 0.4

## 2019-03-21 MED ORDER — OXYCODONE HCL 5 MG PO TABS
5.0000 mg | ORAL_TABLET | ORAL | Status: DC | PRN
  Administered 2019-03-21 – 2019-03-22 (×4): 10 mg via ORAL
  Administered 2019-03-23: 5 mg via ORAL
  Administered 2019-03-23: 10 mg via ORAL
  Filled 2019-03-21 (×6): qty 2

## 2019-03-21 MED ORDER — ONDANSETRON HCL 4 MG/2ML IJ SOLN
4.0000 mg | Freq: Once | INTRAMUSCULAR | Status: AC
  Administered 2019-03-21: 4 mg via INTRAVENOUS
  Filled 2019-03-21: qty 2

## 2019-03-21 MED ORDER — POLYETHYLENE GLYCOL 3350 17 G PO PACK
17.0000 g | PACK | Freq: Every day | ORAL | Status: DC | PRN
  Filled 2019-03-21: qty 1

## 2019-03-21 MED ORDER — ONDANSETRON HCL 4 MG/2ML IJ SOLN: 4.0000 mg | Freq: Once | INTRAMUSCULAR | Status: AC

## 2019-03-21 MED ORDER — FENTANYL CITRATE (PF) 100 MCG/2ML IJ SOLN
50.0000 ug | INTRAMUSCULAR | Status: AC | PRN
  Administered 2019-03-21 (×2): 50 ug via INTRAVENOUS
  Filled 2019-03-21: qty 2

## 2019-03-21 MED ORDER — CEFAZOLIN SODIUM-DEXTROSE 2-4 GM/100ML-% IV SOLN
2.0000 g | INTRAVENOUS | Status: DC
  Filled 2019-03-21: qty 100

## 2019-03-21 NOTE — ED Notes (Addendum)
ED TO INPATIENT HANDOFF REPORT  ED Nurse Name and Phone #: Geraldine ContrasDee [phone]  S Name/Age/Gender Sheila FaviaShipara Gretel AcreNechelle Kemp 33 y.o. female Room/Bed: ED36A/ED36A  Code Status   Code Status: Full Code  Home/SNF/Other Home Patient oriented to: self, place, time and situation Is this baseline? Yes   Triage Complete: Triage complete  Chief Complaint Acute cholecystitis [K81.0]  Triage Note Pt reports she has been having abd pain off and of for a couple weeks- was told that it was her gallbladder, scheduled for surgery on 03/29/19, states around 2330 she began having vomiting and pain worsened.     Allergies No Known Allergies  Level of Care/Admitting Diagnosis ED Disposition    ED Disposition Condition Comment   Admit  Hospital Area: [facility] [[id number]]  Level of Care: Med-Surg [16]  Covid Evaluation: Asymptomatic Screening Protocol (No Symptoms)  Diagnosis: Acute cholecystitis [575.0.ICD-9-CM]  Admitting Physician: Henrene DodgeISCOYA, JOSE [[id number]]  Attending Physician: Henrene DodgeISCOYA, JOSE [[id number]]  Estimated length of stay: past midnight tomorrow  Certification:: I certify this patient will need inpatient services for at least 2 midnights       B Medical/Surgery History History reviewed. No pertinent past medical history. Past Surgical History:  Procedure Laterality Date  . ECTOPIC PREGNANCY SURGERY    . LASER ABLATION       A IV Location/Drains/Wounds Patient Lines/Drains/Airways Status   Active Line/Drains/Airways    Name:   Placement date:   Placement time:   Site:   Days:   Peripheral IV 03/21/19 Left Hand   03/21/19    0452    Hand   less than 1          Intake/Output Last 24 hours No intake or output data in the 24 hours ending 03/21/19 1911  Labs/Imaging Results for orders placed or performed during the hospital encounter of 03/21/19 (from the past 48 hour(s))  Lipase, blood     Status: None   Collection Time: 03/21/19  3:45 AM  Result Value Ref  Range   Lipase 25 11 - 51 U/L    Comment: Performed at [facility], [address] [place], [place] [address]  Comprehensive metabolic panel     Status: Abnormal   Collection Time: 03/21/19  3:45 AM  Result Value Ref Range   Sodium 136 135 - 145 mmol/L   Potassium 3.7 3.5 - 5.1 mmol/L   Chloride 106 98 - 111 mmol/L   CO2 19 (L) 22 - 32 mmol/L   Glucose, Bld 133 (H) 70 - 99 mg/dL   BUN 14 6 - 20 mg/dL   Creatinine, Ser 8.290.80 0.44 - 1.00 mg/dL   Calcium 8.7 (L) 8.9 - 10.3 mg/dL   Total Protein 7.1 6.5 - 8.1 g/dL   Albumin 3.7 3.5 - 5.0 g/dL   AST 35 15 - 41 U/L   ALT 51 (H) 0 - 44 U/L   Alkaline Phosphatase 41 38 - 126 U/L   Total Bilirubin 0.5 0.3 - 1.2 mg/dL   GFR calc non Af Amer >60 >60 mL/min   GFR calc Af Amer >60 >60 mL/min   Anion gap 11 5 - 15    Comment: Performed at [facility], [address] [place], [place] [address]  CBC     Status: Abnormal   Collection Time: 03/21/19  3:45 AM  Result Value Ref Range   WBC 11.7 (H) 4.0 - 10.5 K/uL   RBC 4.40 3.87 - 5.11 MIL/uL   Hemoglobin 12.9 12.0 -  15.0 g/dL   HCT 35.4 56.2 - 56.3 %   MCV 90.7 80.0 - 100.0 fL   MCH 29.3 26.0 - 34.0 pg   MCHC 32.3 30.0 - 36.0 g/dL   RDW 89.3 73.4 - 28.7 %   Platelets 250 150 - 400 K/uL   nRBC 0.0 0.0 - 0.2 %    Comment: Performed at [facility], [address] [place], [place] [address]  Urinalysis, Complete w Microscopic     Status: Abnormal   Collection Time: 03/21/19  3:45 AM  Result Value Ref Range   Color, Urine YELLOW (A) YELLOW   APPearance CLOUDY (A) CLEAR   Specific Gravity, Urine 1.031 (H) 1.005 - 1.030   pH 5.0 5.0 - 8.0   Glucose, UA NEGATIVE NEGATIVE mg/dL   Hgb urine dipstick NEGATIVE NEGATIVE   Bilirubin Urine NEGATIVE NEGATIVE   Ketones, ur NEGATIVE NEGATIVE mg/dL   Protein, ur NEGATIVE NEGATIVE mg/dL   Nitrite NEGATIVE NEGATIVE   Leukocytes,Ua TRACE (A) NEGATIVE   RBC / HPF 0-5 0 - 5 RBC/hpf   WBC, UA 0-5 0 - 5 WBC/hpf    Bacteria, UA RARE (A) NONE SEEN   Squamous Epithelial / LPF 21-50 0 - 5   Mucus PRESENT     Comment: Performed at [facility], [address] [place], [place] [address]   US Abdomen Limited RUQ  Result Date: 03/21/2019 CLINICAL DATA:  Right upper quadrant abdominal pain. Known gallstones. EXAM: ULTRASOUND ABDOMEN LIMITED RIGHT UPPER QUADRANT COMPARISON:  03/07/2019 FINDINGS: Gallbladder: Numerous gallstones are again demonstrated in the gallbladder. The largest calculus measures 1.2 cm. No gallbladder wall thickening, pericholecystic fluid or gallbladder distension. Positive sonographic Murphy sign. Common bile duct: Diameter: 3.0 mm Liver: Normal echogenicity without focal lesion or biliary dilatation. Portal vein is patent on color Doppler imaging with normal direction of blood flow towards the liver. Other: None. IMPRESSION: 1. Cholelithiasis without sonographic findings for acute cholecystitis. There was a positive sonographic Murphy sign. Recommend clinical correlation. 2. Normal caliber common bile duct. 3. Normal sonographic appearance of the liver. Electronically Signed   By: Rudie Meyer M.D.   On: 03/21/2019 08:38    Pending Labs Unresulted Labs (From admission, onward)    Start     Ordered   03/22/19 0500  Magnesium  Tomorrow morning,   STAT     03/21/19 1258   03/22/19 0500  CBC  Tomorrow morning,   STAT     03/21/19 1258   03/22/19 0500  Comprehensive metabolic panel  Tomorrow morning,   STAT     03/21/19 1258   03/21/19 1256  HIV Antibody (routine testing w rflx)  (HIV Antibody (Routine testing w reflex) panel)  Once,   STAT     03/21/19 1258   03/21/19 1254  SARS CORONAVIRUS 2 (TAT 6-24 HRS) Nasopharyngeal Nasopharyngeal Swab  (Tier 3 (TAT 6-24 hrs))  Once,   STAT    Question Answer Comment  Is this test for diagnosis or screening Screening   Symptomatic for COVID-19 as defined by CDC No   Hospitalized for COVID-19 No   Admitted to ICU for COVID-19 No    Previously tested for COVID-19 No   Resident in a congregate (group) care setting No   Employed in healthcare setting No   Pregnant No      03/21/19 1254          Vitals/Pain Today's Vitals   03/21/19 1559 03/21/19 1634 03/21/19 1716 03/21/19 1728  BP:  136/80 116/68 122/76   Pulse: 66 (!) 51 75   Resp:      Temp:      TempSrc:      SpO2: 98% 98% 100%   Weight:      Height:      PainSc:    8     Isolation Precautions No active isolations  Medications Medications  sodium chloride flush (NS) 0.9 % injection 3 mL (3 mLs Intravenous Not Given 03/21/19 0736)  oxyCODONE-acetaminophen (PERCOCET/ROXICET) 5-325 MG per tablet 1 tablet (1 tablet Oral Given 03/21/19 0346)  lactated ringers infusion (125 mL/hr Intravenous New Bag/Given 03/21/19 1358)  acetaminophen (TYLENOL) tablet 1,000 mg (1,000 mg Oral Given 03/21/19 1754)  ketorolac (TORADOL) 30 MG/ML injection 30 mg (30 mg Intravenous Given 03/21/19 1402)  oxyCODONE (Oxy IR/ROXICODONE) immediate release tablet 5-10 mg (has no administration in time range)  HYDROmorphone (DILAUDID) injection 0.5 mg (0.5 mg Intravenous Given 03/21/19 1730)  polyethylene glycol (MIRALAX / GLYCOLAX) packet 17 g (has no administration in time range)  ondansetron (ZOFRAN-ODT) disintegrating tablet 4 mg (has no administration in time range)    Or  ondansetron (ZOFRAN) injection 4 mg (has no administration in time range)  pantoprazole (PROTONIX) injection 40 mg (has no administration in time range)  enoxaparin (LOVENOX) injection 40 mg (40 mg Subcutaneous Given 03/21/19 1404)  piperacillin-tazobactam (ZOSYN) IVPB 3.375 g (3.375 g Intravenous New Bag/Given 03/21/19 1400)  ondansetron (ZOFRAN-ODT) disintegrating tablet 4 mg (4 mg Oral Given 03/21/19 0346)  fentaNYL (SUBLIMAZE) injection 50 mcg (50 mcg Intravenous Given 03/21/19 0526)  ondansetron (ZOFRAN) injection 4 mg (4 mg Intravenous Given 03/21/19 0451)  fentaNYL (SUBLIMAZE) injection 50 mcg (50  mcg Intravenous Given 03/21/19 0612)  ketorolac (TORADOL) 30 MG/ML injection 30 mg (30 mg Intravenous Given 03/21/19 0736)  morphine 4 MG/ML injection 4 mg (4 mg Intravenous Given 03/21/19 1234)  ondansetron (ZOFRAN) injection 4 mg (4 mg Intravenous Given 03/21/19 1154)    Mobility walks Low fall risk   Focused Assessments    R Recommendations: See Admitting Provider Note  Report given to: Eustaquio Maize, RN

## 2019-03-21 NOTE — ED Notes (Signed)
Pt had incident of emesis while in waiting room, acute pain noted

## 2019-03-21 NOTE — ED Provider Notes (Signed)
[facility] Emergency Department Provider Note   ____________________________________________    I have reviewed the triage vital signs and the nursing notes.   HISTORY  Chief Complaint Abdominal Pain and Emesis     HPI Sheila Kemp is a 33 y.o. female who presents with complaints of right upper quadrant abdominal pain.  She reports this episode started at 11 PM last night and has been severe.  She did take Vicodin with no improvement.  Patient seen by me 2 weeks ago had ultrasound significant for gallstones.  Has been doing well.  Has not seen general surgery yet.  Reports nausea no vomiting.  Normal stools.  No radiation of pain  History reviewed. No pertinent past medical history.  Patient Active Problem List   Diagnosis Date Noted  . Acute cholecystitis 03/21/2019    Past Surgical History:  Procedure Laterality Date  . ECTOPIC PREGNANCY SURGERY    . LASER ABLATION      Prior to Admission medications   Medication Sig Start Date End Date Taking? Authorizing Provider  HYDROcodone-acetaminophen (NORCO/VICODIN) 5-325 MG tablet Take 1 tablet by mouth every 6 (six) hours as needed for moderate pain. 03/07/19  Yes Lavonia Drafts, MD  ondansetron (ZOFRAN ODT) 4 MG disintegrating tablet Take 1 tablet (4 mg total) by mouth every 8 (eight) hours as needed. Patient taking differently: Take 4 mg by mouth every 8 (eight) hours as needed for nausea or vomiting.  03/07/19  Yes Lavonia Drafts, MD     Allergies Patient has no known allergies.  No family history on file.  Social History Social History   Tobacco Use  . Smoking status: Current Every Day Smoker  . Smokeless tobacco: Never Used  Substance Use Topics  . Alcohol use: Yes  . Drug use: No    Review of Systems  Constitutional: No fever/chills Eyes: No visual changes.  ENT: No sore throat. Cardiovascular: Denies chest pain. Respiratory: Denies shortness of  breath. Gastrointestinal: As above Genitourinary: Negative for dysuria. Musculoskeletal: Negative for back pain. Skin: Negative for rash. Neurological: Negative for headaches    ____________________________________________   PHYSICAL EXAM:  VITAL SIGNS: ED Triage Vitals  Enc Vitals Group     BP 03/21/19 0341 (!) 115/49     Pulse Rate 03/21/19 0341 73     Resp 03/21/19 0341 16     Temp 03/21/19 0341 97.8 F (36.6 C)     Temp Source 03/21/19 0745 Oral     SpO2 03/21/19 0341 100 %     Weight 03/21/19 0341 114 kg (251 lb 5.2 oz)     Height 03/21/19 0341 1.778 m (5\' 10" )     Head Circumference --      Peak Flow --      Pain Score 03/21/19 0341 10     Pain Loc --      Pain Edu? --      Excl. in [place]? --     Constitutional: Alert and oriented.  Patient actively vomiting Nose: No congestion/rhinnorhea. Mouth/Throat: Mucous membranes are moist.   Neck:  Painless ROM Cardiovascular: Normal rate, regular rhythm.  Good peripheral circulation. Respiratory: Normal respiratory effort.  No retractions.  Gastrointestinal: Tender right upper quadrant, no distention, no CVA tenderness  Musculoskeletal:  Warm and well perfused Neurologic:  Normal speech and language. No gross focal neurologic deficits are appreciated.  Skin:  Skin is warm, dry and intact. No rash noted. Psychiatric: Mood and affect are normal. Speech and behavior  are normal.  ____________________________________________   LABS (all labs ordered are listed, but only abnormal results are displayed)  Labs Reviewed  COMPREHENSIVE METABOLIC PANEL - Abnormal; Notable for the following components:      Result Value   CO2 19 (*)    Glucose, Bld 133 (*)    Calcium 8.7 (*)    ALT 51 (*)    All other components within normal limits  CBC - Abnormal; Notable for the following components:   WBC 11.7 (*)    All other components within normal limits  URINALYSIS, COMPLETE (UACMP) WITH MICROSCOPIC - Abnormal; Notable for the  following components:   Color, Urine YELLOW (*)    APPearance CLOUDY (*)    Specific Gravity, Urine 1.031 (*)    Leukocytes,Ua TRACE (*)    Bacteria, UA RARE (*)    All other components within normal limits  SARS CORONAVIRUS 2 (TAT 6-24 HRS)  LIPASE, BLOOD  HIV ANTIBODY (ROUTINE TESTING W REFLEX)  POC URINE PREG, ED   ____________________________________________  EKG  None ____________________________________________  RADIOLOGY  Ultrasound demonstrates cholelithiasis no evidence of cholecystitis ____________________________________________   PROCEDURES  Procedure(s) performed: No  Procedures   Critical Care performed: No ____________________________________________   INITIAL IMPRESSION / ASSESSMENT AND PLAN / ED COURSE  Pertinent labs & imaging results that were available during my care of the patient were reviewed by me and considered in my medical decision making (see chart for details).  Patient presents with right upper quadrant pain, she has had episodes of biliary colic for months now, this is a particular severe episode.  Lab work is overall reassuring, treated with multiple doses of fentanyl with little improvement.  Given IV Toradol with improvement.  Repeat ultrasound demonstrates cholelithiasis no evidence of cholecystitis  ----------------------------------------- 9:21 AM on 03/21/2019 -----------------------------------------  Discussed with Dr. Aleen Campi of surgery who has seen the patient in his office, recommends observing to see if pain is improved if not he will see her in the department   Dr. Aleen Campi will admit the patient   ____________________________________________   FINAL CLINICAL IMPRESSION(S) / ED DIAGNOSES  Final diagnoses:  Upper abdominal pain  Calculus of gallbladder without cholecystitis without obstruction        Note:  This document was prepared using Dragon voice recognition software and may include unintentional  dictation errors.   Jene Every, MD 03/21/19 [phone]

## 2019-03-21 NOTE — ED Triage Notes (Signed)
Pt reports she has been having abd pain off and of for a couple weeks- was told that it was her gallbladder, scheduled for surgery on 03/29/19, states around 2330 she began having vomiting and pain worsened.

## 2019-03-21 NOTE — ED Notes (Signed)
Pt sitting in sub wait  conts to have pain and nausea  Dr Corky Downs in with pt

## 2019-03-21 NOTE — ED Notes (Signed)
Pt is in obvious pain. Pt reports no improvement from pain medication.

## 2019-03-21 NOTE — H&P (Signed)
Date of Admission:  03/21/2019  Reason for Admission:  Symptomatic cholelithiasis  History of Present Illness: Sheila Kemp is a 33 y.o. female presenting with another episode of biliary colic.  She is known to me and was seen in office on 12/15 after an episode of biliary colic in the ED.  She was scheduled for laparoscopic cholecystectomy on 03/29/19, but last night around 11:30 pm, she developed another episode.  She describes this as even more severe compared to the last one.  Associated with nausea and vomiting both at home and in the ED.  Denies any fevers or chills.  She reports the pain is most severe in the epigastric and also right upper quadrant, and there is radiation to the back.    In the ED today, she has received doses of pain medication but the pain persists.  I was contacted by Dr. Cyril LoosenKinner to evaluate her for possible admission.  Her WBC is only mildly elevated to 11.7 with normal total bilirubin of 0.5 and alkaline phosphatase of 41.  Lipase is normal at 25.  However, her ALT is mildly elevated to 51.  U/S was repeated showing multiple gallstones, but no pericholecystic fluid or wall thickening.   Past Medical History: --Symptomatic cholelithiasis --Ectopic pregnancy --Kidney stones  Past Surgical History: Past Surgical History:  Procedure Laterality Date  . ECTOPIC PREGNANCY SURGERY    . LASER ABLATION      Home Medications: Prior to Admission medications   Medication Sig Start Date End Date Taking? Authorizing Provider  HYDROcodone-acetaminophen (NORCO/VICODIN) 5-325 MG tablet Take 1 tablet by mouth every 6 (six) hours as needed for moderate pain. 03/07/19  Yes Jene EveryKinner, Robert, MD  ondansetron (ZOFRAN ODT) 4 MG disintegrating tablet Take 1 tablet (4 mg total) by mouth every 8 (eight) hours as needed. Patient taking differently: Take 4 mg by mouth every 8 (eight) hours as needed for nausea or vomiting.  03/07/19  Yes Jene EveryKinner, Robert, MD    Allergies: No Known  Allergies  Social History:  reports that she has been smoking. She has never used smokeless tobacco. She reports current alcohol use. She reports that she does not use drugs.   Family History: No family history on file.  Review of Systems: Review of Systems  Constitutional: Negative for chills and fever.  HENT: Negative for hearing loss.   Respiratory: Negative for shortness of breath.   Cardiovascular: Negative for chest pain.  Gastrointestinal: Positive for abdominal pain, nausea and vomiting. Negative for constipation and diarrhea.  Genitourinary: Negative for dysuria.  Musculoskeletal: Positive for back pain.  Skin: Negative for rash.  Neurological: Negative for dizziness.  Psychiatric/Behavioral: Negative for depression.    Physical Exam BP 130/75 (BP Location: Left Arm)   Pulse (!) 58   Temp 98.1 F (36.7 C) (Oral)   Resp 20   Ht 5\' 10"  (1.778 m)   Wt 114 kg   LMP 03/02/2019 (Exact Date)   SpO2 100%   BMI 36.06 kg/m  CONSTITUTIONAL: No acute distress HEENT:  Normocephalic, atraumatic, extraocular motion intact. NECK: Trachea is midline, and there is no jugular venous distension.  RESPIRATORY:  Lungs are clear, and breath sounds are equal bilaterally. Normal respiratory effort without pathologic use of accessory muscles. CARDIOVASCULAR: Heart is regular without murmurs, gallops, or rubs. GI: The abdomen is soft, non-distended, with tenderness to palpation mostly in epigastric and right upper quadrant.  Positive Murphy's sign. MUSCULOSKELETAL:  Normal muscle strength and tone in all four extremities.  No peripheral  edema or cyanosis. SKIN: Skin turgor is normal. There are no pathologic skin lesions.  NEUROLOGIC:  Motor and sensation is grossly normal.  Cranial nerves are grossly intact. PSYCH:  Alert and oriented to person, place and time. Affect is normal.  Laboratory Analysis: Results for orders placed or performed during the hospital encounter of 03/21/19 (from  the past 24 hour(s))  Lipase, blood     Status: None   Collection Time: 03/21/19  3:45 AM  Result Value Ref Range   Lipase 25 11 - 51 U/L  Comprehensive metabolic panel     Status: Abnormal   Collection Time: 03/21/19  3:45 AM  Result Value Ref Range   Sodium 136 135 - 145 mmol/L   Potassium 3.7 3.5 - 5.1 mmol/L   Chloride 106 98 - 111 mmol/L   CO2 19 (L) 22 - 32 mmol/L   Glucose, Bld 133 (H) 70 - 99 mg/dL   BUN 14 6 - 20 mg/dL   Creatinine, Ser 4.01 0.44 - 1.00 mg/dL   Calcium 8.7 (L) 8.9 - 10.3 mg/dL   Total Protein 7.1 6.5 - 8.1 g/dL   Albumin 3.7 3.5 - 5.0 g/dL   AST 35 15 - 41 U/L   ALT 51 (H) 0 - 44 U/L   Alkaline Phosphatase 41 38 - 126 U/L   Total Bilirubin 0.5 0.3 - 1.2 mg/dL   GFR calc non Af Amer >60 >60 mL/min   GFR calc Af Amer >60 >60 mL/min   Anion gap 11 5 - 15  CBC     Status: Abnormal   Collection Time: 03/21/19  3:45 AM  Result Value Ref Range   WBC 11.7 (H) 4.0 - 10.5 K/uL   RBC 4.40 3.87 - 5.11 MIL/uL   Hemoglobin 12.9 12.0 - 15.0 g/dL   HCT 02.7 25.3 - 66.4 %   MCV 90.7 80.0 - 100.0 fL   MCH 29.3 26.0 - 34.0 pg   MCHC 32.3 30.0 - 36.0 g/dL   RDW 40.3 47.4 - 25.9 %   Platelets 250 150 - 400 K/uL   nRBC 0.0 0.0 - 0.2 %  Urinalysis, Complete w Microscopic     Status: Abnormal   Collection Time: 03/21/19  3:45 AM  Result Value Ref Range   Color, Urine YELLOW (A) YELLOW   APPearance CLOUDY (A) CLEAR   Specific Gravity, Urine 1.031 (H) 1.005 - 1.030   pH 5.0 5.0 - 8.0   Glucose, UA NEGATIVE NEGATIVE mg/dL   Hgb urine dipstick NEGATIVE NEGATIVE   Bilirubin Urine NEGATIVE NEGATIVE   Ketones, ur NEGATIVE NEGATIVE mg/dL   Protein, ur NEGATIVE NEGATIVE mg/dL   Nitrite NEGATIVE NEGATIVE   Leukocytes,Ua TRACE (A) NEGATIVE   RBC / HPF 0-5 0 - 5 RBC/hpf   WBC, UA 0-5 0 - 5 WBC/hpf   Bacteria, UA RARE (A) NONE SEEN   Squamous Epithelial / LPF 21-50 0 - 5   Mucus PRESENT     Imaging: US Abdomen Limited RUQ  Result Date: 03/21/2019 CLINICAL DATA:   Right upper quadrant abdominal pain. Known gallstones. EXAM: ULTRASOUND ABDOMEN LIMITED RIGHT UPPER QUADRANT COMPARISON:  03/07/2019 FINDINGS: Gallbladder: Numerous gallstones are again demonstrated in the gallbladder. The largest calculus measures 1.2 cm. No gallbladder wall thickening, pericholecystic fluid or gallbladder distension. Positive sonographic Murphy sign. Common bile duct: Diameter: 3.0 mm Liver: Normal echogenicity without focal lesion or biliary dilatation. Portal vein is patent on color Doppler imaging with normal direction of blood flow towards the  liver. Other: None. IMPRESSION: 1. Cholelithiasis without sonographic findings for acute cholecystitis. There was a positive sonographic Murphy sign. Recommend clinical correlation. 2. Normal caliber common bile duct. 3. Normal sonographic appearance of the liver. Electronically Signed   By: Marijo Sanes M.D.   On: 03/21/2019 08:38    Assessment and Plan: This is a 33 y.o. female with clinical acute cholecystitis.  --Patient presents with another episode of a more severe biliary colic which has not improved despite of pain medications.  Clinically she has cholecystitis despite of normal wall thickening and absence of pericholecystic fluid.  Likely one of the gallstones is lodged in the neck. --patient will be admitted to surgical team.  Will have clear liquids today and be NPO after midnight for laparoscopic cholecystectomy tomorrow morning.  Unfortunately, there is no OR availability to do the surgery today.  Patient understands.  She will be started on IV fluid hydration, IV antibiotics, and appropriate pain/nausea medication.     Melvyn Neth, MD [facility] Pg:  [phone]

## 2019-03-21 NOTE — ED Notes (Signed)
Pt on knees in bathroom, pulling emergency cord. Pt is vomiting.

## 2019-03-21 NOTE — ED Notes (Signed)
Pt vomiting in subwait area, zofran given.

## 2019-03-21 NOTE — Plan of Care (Signed)
To mother/baby unit at 1950; RN reviewed plan of care with patient regarding pain meds, antiobiotic and NPO after midnight

## 2019-03-22 ENCOUNTER — Encounter: Payer: Self-pay | Admitting: Surgery

## 2019-03-22 ENCOUNTER — Other Ambulatory Visit: Payer: Self-pay

## 2019-03-22 ENCOUNTER — Inpatient Hospital Stay: Payer: PRIVATE HEALTH INSURANCE | Admitting: Certified Registered Nurse Anesthetist

## 2019-03-22 ENCOUNTER — Encounter
Admission: EM | Disposition: A | Discharge status: Home / Self Care | Payer: Self-pay | Source: Home / Self Care | Attending: Surgery

## 2019-03-22 HISTORY — PX: CHOLECYSTECTOMY: SHX55

## 2019-03-22 LAB — CBC
HCT: 38.7 % (ref 36.0–46.0)
Hemoglobin: 12.5 g/dL (ref 12.0–15.0)
MCH: 29.3 pg (ref 26.0–34.0)
MCHC: 32.3 g/dL (ref 30.0–36.0)
MCV: 90.6 fL (ref 80.0–100.0)
Platelets: 226 10*3/uL (ref 150–400)
RBC: 4.27 MIL/uL (ref 3.87–5.11)
RDW: 13.2 % (ref 11.5–15.5)
WBC: 9.7 10*3/uL (ref 4.0–10.5)
nRBC: 0 % (ref 0.0–0.2)

## 2019-03-22 LAB — COMPREHENSIVE METABOLIC PANEL
ALT: 118 U/L — ABNORMAL HIGH (ref 0–44)
AST: 94 U/L — ABNORMAL HIGH (ref 15–41)
Albumin: 3.4 g/dL — ABNORMAL LOW (ref 3.5–5.0)
Alkaline Phosphatase: 41 U/L (ref 38–126)
Anion gap: 9 (ref 5–15)
BUN: 12 mg/dL (ref 6–20)
CO2: 21 mmol/L — ABNORMAL LOW (ref 22–32)
Calcium: 8.7 mg/dL — ABNORMAL LOW (ref 8.9–10.3)
Chloride: 104 mmol/L (ref 98–111)
Creatinine, Ser: 0.79 mg/dL (ref 0.44–1.00)
GFR calc Af Amer: >60 mL/min (ref >60)
GFR calc non Af Amer: >60 mL/min (ref >60)
Glucose, Bld: 93 mg/dL (ref 70–99)
Potassium: 3.5 mmol/L (ref 3.5–5.1)
Sodium: 134 mmol/L — ABNORMAL LOW (ref 135–145)
Total Bilirubin: 1.6 mg/dL — ABNORMAL HIGH (ref 0.3–1.2)
Total Protein: 6.8 g/dL (ref 6.5–8.1)

## 2019-03-22 LAB — MAGNESIUM: Magnesium: 2 mg/dL (ref 1.7–2.4)

## 2019-03-22 LAB — SARS CORONAVIRUS 2 (TAT 6-24 HRS): SARS Coronavirus 2: NEGATIVE

## 2019-03-22 LAB — PREGNANCY, URINE: Preg Test, Ur: NEGATIVE

## 2019-03-22 SURGERY — LAPAROSCOPIC CHOLECYSTECTOMY
Anesthesia: General

## 2019-03-22 MED ORDER — SUCCINYLCHOLINE CHLORIDE 20 MG/ML IJ SOLN
INTRAMUSCULAR | Status: AC
  Filled 2019-03-22: qty 1

## 2019-03-22 MED ORDER — PHENYLEPHRINE HCL (PRESSORS) 10 MG/ML IV SOLN
INTRAVENOUS | Status: DC | PRN
  Administered 2019-03-22: 200 ug via INTRAVENOUS
  Administered 2019-03-22: 100 ug via INTRAVENOUS

## 2019-03-22 MED ORDER — FENTANYL CITRATE (PF) 100 MCG/2ML IJ SOLN
25.0000 ug | INTRAMUSCULAR | Status: DC | PRN
  Administered 2019-03-22 (×3): 25 ug via INTRAVENOUS

## 2019-03-22 MED ORDER — BUPIVACAINE-EPINEPHRINE (PF) 0.25% -1:200000 IJ SOLN
INTRAMUSCULAR | Status: DC | PRN
  Administered 2019-03-22: 30 mL

## 2019-03-22 MED ORDER — PROPOFOL 10 MG/ML IV BOLUS
INTRAVENOUS | Status: AC
  Filled 2019-03-22: qty 20

## 2019-03-22 MED ORDER — DEXAMETHASONE SODIUM PHOSPHATE 10 MG/ML IJ SOLN
INTRAMUSCULAR | Status: DC | PRN
  Administered 2019-03-22: 10 mg via INTRAVENOUS

## 2019-03-22 MED ORDER — MIDAZOLAM HCL 2 MG/2ML IJ SOLN
INTRAMUSCULAR | Status: AC
  Filled 2019-03-22: qty 2

## 2019-03-22 MED ORDER — SUGAMMADEX SODIUM 200 MG/2ML IV SOLN
INTRAVENOUS | Status: DC | PRN
  Administered 2019-03-22: 250 mg via INTRAVENOUS

## 2019-03-22 MED ORDER — ROCURONIUM BROMIDE 50 MG/5ML IV SOLN
INTRAVENOUS | Status: AC
  Filled 2019-03-22: qty 1

## 2019-03-22 MED ORDER — LIDOCAINE HCL (CARDIAC) PF 100 MG/5ML IV SOSY
PREFILLED_SYRINGE | INTRAVENOUS | Status: DC | PRN
  Administered 2019-03-22: 100 mg via INTRAVENOUS

## 2019-03-22 MED ORDER — ONDANSETRON HCL 4 MG/2ML IJ SOLN
INTRAMUSCULAR | Status: DC | PRN
  Administered 2019-03-22: 4 mg via INTRAVENOUS

## 2019-03-22 MED ORDER — PROPOFOL 10 MG/ML IV BOLUS
INTRAVENOUS | Status: DC | PRN
  Administered 2019-03-22: 30 mg via INTRAVENOUS
  Administered 2019-03-22: 200 mg via INTRAVENOUS

## 2019-03-22 MED ORDER — FENTANYL CITRATE (PF) 100 MCG/2ML IJ SOLN
INTRAMUSCULAR | Status: AC
  Filled 2019-03-22: qty 2

## 2019-03-22 MED ORDER — DEXAMETHASONE SODIUM PHOSPHATE 10 MG/ML IJ SOLN
INTRAMUSCULAR | Status: AC
  Filled 2019-03-22: qty 1

## 2019-03-22 MED ORDER — KETOROLAC TROMETHAMINE 30 MG/ML IJ SOLN
INTRAMUSCULAR | Status: AC
  Filled 2019-03-22: qty 1

## 2019-03-22 MED ORDER — EPINEPHRINE PF 1 MG/ML IJ SOLN
INTRAMUSCULAR | Status: AC
  Filled 2019-03-22: qty 1

## 2019-03-22 MED ORDER — CHLORHEXIDINE GLUCONATE CLOTH 2 % EX PADS
6.0000 | MEDICATED_PAD | Freq: Once | CUTANEOUS | Status: AC
  Administered 2019-03-22: 6 via TOPICAL

## 2019-03-22 MED ORDER — ROCURONIUM BROMIDE 100 MG/10ML IV SOLN
INTRAVENOUS | Status: DC | PRN
  Administered 2019-03-22: 10 mg via INTRAVENOUS
  Administered 2019-03-22: 50 mg via INTRAVENOUS
  Administered 2019-03-22: 10 mg via INTRAVENOUS

## 2019-03-22 MED ORDER — LACTATED RINGERS IV SOLN: INTRAVENOUS | Status: DC

## 2019-03-22 MED ORDER — MIDAZOLAM HCL 2 MG/2ML IJ SOLN
INTRAMUSCULAR | Status: DC | PRN
  Administered 2019-03-22: 2 mg via INTRAVENOUS

## 2019-03-22 MED ORDER — GLYCOPYRROLATE 0.2 MG/ML IJ SOLN
INTRAMUSCULAR | Status: AC
  Filled 2019-03-22: qty 1

## 2019-03-22 MED ORDER — GLYCOPYRROLATE 0.2 MG/ML IJ SOLN
INTRAMUSCULAR | Status: DC | PRN
  Administered 2019-03-22: .2 mg via INTRAVENOUS

## 2019-03-22 MED ORDER — OXYCODONE HCL 5 MG PO TABS: 5.0000 mg | ORAL_TABLET | Freq: Once | ORAL | Status: DC | PRN

## 2019-03-22 MED ORDER — SUGAMMADEX SODIUM 500 MG/5ML IV SOLN
INTRAVENOUS | Status: AC
  Filled 2019-03-22: qty 5

## 2019-03-22 MED ORDER — ONDANSETRON HCL 4 MG/2ML IJ SOLN
INTRAMUSCULAR | Status: AC
  Filled 2019-03-22: qty 2

## 2019-03-22 MED ORDER — BUPIVACAINE HCL (PF) 0.25 % IJ SOLN
INTRAMUSCULAR | Status: AC
  Filled 2019-03-22: qty 30

## 2019-03-22 MED ORDER — FENTANYL CITRATE (PF) 100 MCG/2ML IJ SOLN
INTRAMUSCULAR | Status: DC | PRN
  Administered 2019-03-22: 50 ug via INTRAVENOUS
  Administered 2019-03-22: 100 ug via INTRAVENOUS

## 2019-03-22 MED ORDER — ACETAMINOPHEN 500 MG PO TABS
1000.0000 mg | ORAL_TABLET | ORAL | Status: AC
  Administered 2019-03-22: 1000 mg via ORAL
  Filled 2019-03-22: qty 2

## 2019-03-22 MED ORDER — OXYCODONE HCL 5 MG/5ML PO SOLN: 5.0000 mg | Freq: Once | ORAL | Status: DC | PRN

## 2019-03-22 MED ORDER — SEVOFLURANE IN SOLN
RESPIRATORY_TRACT | Status: AC
  Filled 2019-03-22: qty 250

## 2019-03-22 MED ORDER — FENTANYL CITRATE (PF) 100 MCG/2ML IJ SOLN
INTRAMUSCULAR | Status: AC
  Administered 2019-03-22: 25 ug via INTRAVENOUS
  Filled 2019-03-22: qty 2

## 2019-03-22 MED ORDER — CHLORHEXIDINE GLUCONATE CLOTH 2 % EX PADS
6.0000 | MEDICATED_PAD | Freq: Once | CUTANEOUS | Status: AC
  Administered 2019-03-21: 6 via TOPICAL

## 2019-03-22 MED ORDER — GABAPENTIN 300 MG PO CAPS
300.0000 mg | ORAL_CAPSULE | ORAL | Status: AC
  Administered 2019-03-22: 300 mg via ORAL
  Filled 2019-03-22: qty 1

## 2019-03-22 MED ORDER — LIDOCAINE HCL (PF) 2 % IJ SOLN
INTRAMUSCULAR | Status: AC
  Filled 2019-03-22: qty 5

## 2019-03-22 SURGICAL SUPPLY — 46 items
APPLIER CLIP 5 13 M/L LIGAMAX5 (MISCELLANEOUS) ×3
BLADE SURG 15 STRL LF DISP TIS (BLADE) ×1 IMPLANT
BLADE SURG 15 STRL SS (BLADE) ×2
CANISTER SUCT 1200ML W/VALVE (MISCELLANEOUS) ×3 IMPLANT
CATH CHOLANGI 4FR 420404F (CATHETERS) IMPLANT
CHLORAPREP W/TINT 26 (MISCELLANEOUS) ×3 IMPLANT
CLIP APPLIE 5 13 M/L LIGAMAX5 (MISCELLANEOUS) ×1 IMPLANT
CONRAY 60ML FOR OR (MISCELLANEOUS) IMPLANT
COVER WAND RF STERILE (DRAPES) ×3 IMPLANT
DERMABOND ADVANCED (GAUZE/BANDAGES/DRESSINGS) ×2
DERMABOND ADVANCED .7 DNX12 (GAUZE/BANDAGES/DRESSINGS) ×1 IMPLANT
DRAPE C-ARM XRAY 36X54 (DRAPES) IMPLANT
ELECT CAUTERY BLADE TIP 2.5 (TIP) ×3
ELECT REM PT RETURN 9FT ADLT (ELECTROSURGICAL) ×3
ELECTRODE CAUTERY BLDE TIP 2.5 (TIP) ×1 IMPLANT
ELECTRODE REM PT RTRN 9FT ADLT (ELECTROSURGICAL) ×1 IMPLANT
GLOVE SURG SYN 7.0 (GLOVE) ×3 IMPLANT
GLOVE SURG SYN 7.5  E (GLOVE) ×2
GLOVE SURG SYN 7.5 E (GLOVE) ×1 IMPLANT
GOWN STRL REUS W/ TWL LRG LVL3 (GOWN DISPOSABLE) ×4 IMPLANT
GOWN STRL REUS W/TWL LRG LVL3 (GOWN DISPOSABLE) ×8
IRRIGATION STRYKERFLOW (MISCELLANEOUS) ×1 IMPLANT
IRRIGATOR STRYKERFLOW (MISCELLANEOUS) ×3
IV CATH ANGIO 12GX3 LT BLUE (NEEDLE) ×3 IMPLANT
IV NS 1000ML (IV SOLUTION) ×2
IV NS 1000ML BAXH (IV SOLUTION) ×1 IMPLANT
JACKSON PRATT 10 (INSTRUMENTS) IMPLANT
L-HOOK LAP DISP 36CM (ELECTROSURGICAL) ×3
LABEL OR SOLS (LABEL) IMPLANT
LHOOK LAP DISP 36CM (ELECTROSURGICAL) ×1 IMPLANT
NEEDLE HYPO 22GX1.5 SAFETY (NEEDLE) ×3 IMPLANT
PACK LAP CHOLECYSTECTOMY (MISCELLANEOUS) ×3 IMPLANT
PENCIL ELECTRO HAND CTR (MISCELLANEOUS) ×3 IMPLANT
POUCH SPECIMEN RETRIEVAL 10MM (ENDOMECHANICALS) ×3 IMPLANT
SCISSORS METZENBAUM CVD 33 (INSTRUMENTS) ×3 IMPLANT
SET TUBE SMOKE EVAC HIGH FLOW (TUBING) ×3 IMPLANT
SLEEVE ADV FIXATION 5X100MM (TROCAR) ×6 IMPLANT
SPONGE VERSALON 4X4 4PLY (MISCELLANEOUS) IMPLANT
SUT MNCRL 4-0 (SUTURE) ×2
SUT MNCRL 4-0 27XMFL (SUTURE) ×1
SUT VIC AB 3-0 SH 27 (SUTURE) ×2
SUT VIC AB 3-0 SH 27X BRD (SUTURE) ×1 IMPLANT
SUT VICRYL 0 AB UR-6 (SUTURE) ×3 IMPLANT
SUTURE MNCRL 4-0 27XMF (SUTURE) ×1 IMPLANT
TROCAR BALLN GELPORT 12X130M (ENDOMECHANICALS) ×3 IMPLANT
TROCAR Z-THREAD OPTICAL 5X100M (TROCAR) ×3 IMPLANT

## 2019-03-22 NOTE — Transfer of Care (Signed)
Immediate Anesthesia Transfer of Care Note  Patient: Sheila Kemp  Procedure(s) Performed: LAPAROSCOPIC CHOLECYSTECTOMY (N/A )  Patient Location: PACU  Anesthesia Type:General  Level of Consciousness: awake and drowsy  Airway & Oxygen Therapy: Patient Spontanous Breathing and Patient connected to face mask oxygen  Post-op Assessment: Report given to RN and Post -op Vital signs reviewed and stable  Post vital signs: Reviewed and stable  Last Vitals:  Vitals Value Taken Time  BP 103/54 03/22/19 0920  Temp    Pulse 102 03/22/19 0922  Resp 17 03/22/19 0922  SpO2 98 % 03/22/19 0922  Vitals shown include unvalidated device data.  Last Pain:  Vitals:   03/22/19 0659  TempSrc: Tympanic  PainSc: 4          Complications: No apparent anesthesia complications

## 2019-03-22 NOTE — Anesthesia Procedure Notes (Signed)
Performed by: Audie Wieser, CRNA       

## 2019-03-22 NOTE — Progress Notes (Signed)
03/22/2019  Subjective: No acute events.  Patient feeling better, with pain much improved.  Vital signs: Temp:  [97.9 F (36.6 C)-99 F (37.2 C)] 99 F (37.2 C) (12/29 0659) Pulse Rate:  [51-79] 65 (12/29 0659) Resp:  [17-20] 17 (12/29 0659) BP: (102-136)/(52-81) 109/52 (12/29 0659) SpO2:  [98 %-100 %] 100 % (12/29 0659) Weight:  [793 kg] 114 kg (12/29 0659)   Intake/Output: 12/28 0701 - 12/29 0700 In: 302.1 [I.V.:202.1; IV Piggyback:100] Out: 650 [Urine:650]    Physical Exam: Constitutional: No acute distress Abdomen:  Soft, obese, non-distended, currently non-tender to palpation.  Labs:  Recent Labs    03/21/19 0345 03/22/19 0622  WBC 11.7* 9.7  HGB 12.9 12.5  HCT 39.9 38.7  PLT 250 226   Recent Labs    03/21/19 0345  NA 136  K 3.7  CL 106  CO2 19*  GLUCOSE 133*  BUN 14  CREATININE 0.80  CALCIUM 8.7*   No results for input(s): LABPROT, INR in the last 72 hours.  Imaging: US Abdomen Limited RUQ  Result Date: 03/21/2019 CLINICAL DATA:  Right upper quadrant abdominal pain. Known gallstones. EXAM: ULTRASOUND ABDOMEN LIMITED RIGHT UPPER QUADRANT COMPARISON:  03/07/2019 FINDINGS: Gallbladder: Numerous gallstones are again demonstrated in the gallbladder. The largest calculus measures 1.2 cm. No gallbladder wall thickening, pericholecystic fluid or gallbladder distension. Positive sonographic Murphy sign. Common bile duct: Diameter: 3.0 mm Liver: Normal echogenicity without focal lesion or biliary dilatation. Portal vein is patent on color Doppler imaging with normal direction of blood flow towards the liver. Other: None. IMPRESSION: 1. Cholelithiasis without sonographic findings for acute cholecystitis. There was a positive sonographic Murphy sign. Recommend clinical correlation. 2. Normal caliber common bile duct. 3. Normal sonographic appearance of the liver. Electronically Signed   By: Marijo Sanes M.D.   On: 03/21/2019 08:38    Assessment/Plan: This is a 33  y.o. female with symptomatic cholelithiasis vs acute cholecystitis.  --Patient to go to OR today for laparoscopic cholecystectomy. --Risks had been discussed already, regarding risk of bleeding, infection, and injury to surrounding structures.  She's willing to proceed and consent is signed.   Melvyn Neth, [facility]

## 2019-03-22 NOTE — Anesthesia Procedure Notes (Addendum)
Procedure Name: Intubation Date/Time: 03/22/2019 7:27 AM Performed by: Johnna Acosta, CRNA Pre-anesthesia Checklist: Patient identified, Emergency Drugs available, Suction available, Patient being monitored and Timeout performed Patient Re-evaluated:Patient Re-evaluated prior to induction Oxygen Delivery Method: Circle system utilized Preoxygenation: Pre-oxygenation with 100% oxygen Induction Type: IV induction Ventilation: Mask ventilation with difficulty and Oral airway inserted - appropriate to patient size Laryngoscope Size: McGraph and 3 Grade View: Grade II Tube type: Oral Tube size: 7.5 mm Number of attempts: 1 Airway Equipment and Method: Stylet,  Video-laryngoscopy and Oral airway Placement Confirmation: ETT inserted through vocal cords under direct vision,  positive ETCO2 and breath sounds checked- equal and bilateral Secured at: 21 cm Tube secured with: Tape Dental Injury: Teeth and Oropharynx as per pre-operative assessment  Difficulty Due To: Difficulty was anticipated, Difficult Airway- due to large tongue and Difficult Airway- due to limited oral opening Future Recommendations: Recommend- induction with short-acting agent, and alternative techniques readily available

## 2019-03-22 NOTE — Anesthesia Preprocedure Evaluation (Signed)
Anesthesia Evaluation  Patient identified by MRN, date of birth, ID band Patient awake    Reviewed: Allergy & Precautions, H&P , NPO status , Patient's Chart, lab work & pertinent test results  History of Anesthesia Complications Negative for: history of anesthetic complications  Airway Mallampati: III  TM Distance: >3 FB Neck ROM: full    Dental  (+) Chipped   Pulmonary neg shortness of breath, Current Smoker and Patient abstained from smoking.,           Cardiovascular (-) angina(-) Past MI and (-) DOE negative cardio ROS       Neuro/Psych negative neurological ROS  negative psych ROS   GI/Hepatic negative GI ROS, Neg liver ROS, neg GERD  ,  Endo/Other  negative endocrine ROS  Renal/GU      Musculoskeletal   Abdominal   Peds  Hematology negative hematology ROS (+)   Anesthesia Other Findings History reviewed. No pertinent past medical history.  Past Surgical History: No date: ECTOPIC PREGNANCY SURGERY No date: LASER ABLATION  BMI    Body Mass Index: 36.06 kg/m      Reproductive/Obstetrics negative OB ROS                             Anesthesia Physical Anesthesia Plan  ASA: II  Anesthesia Plan: General ETT   Post-op Pain Management:    Induction: Intravenous  PONV Risk Score and Plan: Ondansetron, Dexamethasone, Midazolam and Treatment may vary due to age or medical condition  Airway Management Planned: Oral ETT  Additional Equipment:   Intra-op Plan:   Post-operative Plan: Extubation in OR  Informed Consent: I have reviewed the patients History and Physical, chart, labs and discussed the procedure including the risks, benefits and alternatives for the proposed anesthesia with the patient or authorized representative who has indicated his/her understanding and acceptance.     Dental Advisory Given  Plan Discussed with: Anesthesiologist, CRNA and  Surgeon  Anesthesia Plan Comments: (Patient consented for risks of anesthesia including but not limited to:  - adverse reactions to medications - damage to teeth, lips or other oral mucosa - sore throat or hoarseness - Damage to heart, brain, lungs or loss of life  Patient voiced understanding.)        Anesthesia Quick Evaluation

## 2019-03-22 NOTE — Anesthesia Post-op Follow-up Note (Signed)
Anesthesia QCDR form completed.        

## 2019-03-22 NOTE — Op Note (Signed)
  Procedure Date:  03/22/2019  Pre-operative Diagnosis:  Acute cholecystitis  Post-operative Diagnosis:  Acute cholecystitis  Procedure:  Laparoscopic cholecystectomy  Surgeon:  Melvyn Neth, MD  Assistant:  Dareen Piano, PA-S  Anesthesia:  General endotracheal  Estimated Blood Loss:  30 ml  Specimens:  gallbladder  Complications:  None  Indications for Procedure:  This is a 33 y.o. female who presents with abdominal pain and workup revealing acute cholecystitis.  The benefits, complications, treatment options, and expected outcomes were discussed with the patient. The risks of bleeding, infection, recurrence of symptoms, failure to resolve symptoms, bile duct damage, bile duct leak, retained common bile duct stone, bowel injury, and need for further procedures were all discussed with the patient and she was willing to proceed.  Description of Procedure: The patient was correctly identified in the preoperative area and brought into the operating room.  The patient was placed supine with VTE prophylaxis in place.  Appropriate time-outs were performed.  Anesthesia was induced and the patient was intubated.  Appropriate antibiotics were infused.  The abdomen was prepped and draped in a sterile fashion. An infraumbilical incision was made. A cutdown technique was used to enter the abdominal cavity without injury, and a Hasson trocar was inserted.  Pneumoperitoneum was obtained with appropriate opening pressures.  A 5-mm port was placed in the subxiphoid area and two 5-mm ports were placed in the right upper quadrant under direct visualization.  The gallbladder was identified and found to be distended and inflamed.  The fundus was grasped and retracted cephalad.  Adhesions were lysed bluntly and with electrocautery. The infundibulum was grasped and retracted laterally, exposing the peritoneum overlying the gallbladder.  This was incised with electrocautery and extended on either side of  the gallbladder.  The cystic duct and cystic artery were clearly identified and bluntly dissected.  Both were clipped twice proximally and once distally, cutting in between.  The gallbladder was taken from the gallbladder fossa in a retrograde fashion with electrocautery. There was some bile spillage while doing this.  The gallbladder was placed in an Endocatch bag. The liver bed was inspected and any bleeding was controlled with electrocautery. The right upper quadrant was then inspected again revealing intact clips, no bleeding, and no ductal injury.  The area was thoroughly irrigated.  The 5 mm ports were removed under direct visualization and the Hasson trocar was removed.  The Endocatch bag was brought out via the umbilical incision. The fascial opening was closed using 0 vicryl suture.  Local anesthetic was infused in all incisions and the incisions were closed with 4-0 Monocryl.  The wounds were cleaned and sealed with DermaBond.  The patient was emerged from anesthesia and extubated and brought to the recovery room for further management.  The patient tolerated the procedure well and all counts were correct at the end of the case.   Melvyn Neth, MD

## 2019-03-22 NOTE — Anesthesia Postprocedure Evaluation (Signed)
Anesthesia Post Note  Patient: Sheila Kemp  Procedure(s) Performed: LAPAROSCOPIC CHOLECYSTECTOMY (N/A )  Patient location during evaluation: PACU Anesthesia Type: General Level of consciousness: awake and alert Pain management: pain level controlled Vital Signs Assessment: post-procedure vital signs reviewed and stable Respiratory status: spontaneous breathing, nonlabored ventilation, respiratory function stable and patient connected to nasal cannula oxygen Cardiovascular status: blood pressure returned to baseline and stable Postop Assessment: no apparent nausea or vomiting Anesthetic complications: no     Last Vitals:  Vitals:   03/22/19 1015 03/22/19 1020  BP:  (!) 105/57  Pulse: 69 79  Resp: (!) 26 18  Temp:    SpO2: 93% 97%    Last Pain:  Vitals:   03/22/19 1020  TempSrc:   PainSc: 1                  Precious Haws Xitlalli Newhard

## 2019-03-23 ENCOUNTER — Other Ambulatory Visit: Admission: RE | Admit: 2019-03-23 | Payer: PRIVATE HEALTH INSURANCE | Source: Ambulatory Visit

## 2019-03-23 LAB — HEPATIC FUNCTION PANEL
ALT: 116 U/L — ABNORMAL HIGH (ref 0–44)
AST: 68 U/L — ABNORMAL HIGH (ref 15–41)
Albumin: 3.1 g/dL — ABNORMAL LOW (ref 3.5–5.0)
Alkaline Phosphatase: 41 U/L (ref 38–126)
Bilirubin, Direct: 0.2 mg/dL (ref 0.0–0.2)
Indirect Bilirubin: 0.8 mg/dL (ref 0.3–0.9)
Total Bilirubin: 1 mg/dL (ref 0.3–1.2)
Total Protein: 6.1 g/dL — ABNORMAL LOW (ref 6.5–8.1)

## 2019-03-23 LAB — SURGICAL PATHOLOGY

## 2019-03-23 MED ORDER — OXYCODONE HCL 5 MG PO TABS: 5.0000 mg | ORAL_TABLET | ORAL | 0 refills | Status: DC | PRN

## 2019-03-23 MED ORDER — AMOXICILLIN-POT CLAVULANATE 875-125 MG PO TABS: 1.0000 | ORAL_TABLET | Freq: Two times a day (BID) | ORAL | 0 refills | Status: AC

## 2019-03-23 MED ORDER — IBUPROFEN 600 MG PO TABS: 600.0000 mg | ORAL_TABLET | Freq: Three times a day (TID) | ORAL | 0 refills | Status: DC | PRN

## 2019-03-23 NOTE — Progress Notes (Signed)
Patient discharged home. Discharge instructions, prescriptions and follow up appointment given to and reviewed with patient. Patient verbalized understanding. Pt wheeled out by RN.  

## 2019-03-23 NOTE — Discharge Summary (Signed)
Patient ID: Sheila Kemp MRN: [id number] DOB/AGE: 32-May-1987 32 y.o.  Admit date: 03/21/2019 Discharge date: 03/23/2019   Discharge Diagnoses:  Active Problems:   Acute cholecystitis   Procedures:  Laparoscopic cholecystectomy  Hospital Course:  Patient was admitted with acute cholecystitis on 12/28 and underwent surgery on 12/29.  She did well post-operatively and her diet was slowly advanced.  Her pain was well controlled.  She was ambulating, voiding, tolerating diet, and her incisions healing well.  On exam, she was in no acute distress, with stable vital signs.  Her abdomen was soft, non-distended, appropriately tender to palpation.  Incisions clean, dry, intact.  She is ready for discharge from surgical standpoint.  Consults: None  Disposition: Discharge disposition: 01-Home or Self Care       Discharge Instructions    Call MD for:  difficulty breathing, headache or visual disturbances   Complete by: As directed    Call MD for:  persistant nausea and vomiting   Complete by: As directed    Call MD for:  redness, tenderness, or signs of infection (pain, swelling, redness, odor or green/yellow discharge around incision site)   Complete by: As directed    Call MD for:  severe uncontrolled pain   Complete by: As directed    Call MD for:  temperature >100.4   Complete by: As directed    Diet - low sodium heart healthy   Complete by: As directed    Discharge instructions   Complete by: As directed    1.  Patient may shower, but do not scrub wounds heavily and dab dry only. 2.  Do not submerge wounds in pool/tub for 1 week. 3.  Do not apply ointments or hydrogen peroxide to the wounds.   Driving Restrictions   Complete by: As directed    Do not drive while taking narcotics for pain control.   Increase activity slowly   Complete by: As directed    Lifting restrictions   Complete by: As directed    No heavy lifting or pushing of more than 10-15 lbs for 4  weeks.   No dressing needed   Complete by: As directed      Allergies as of 03/23/2019   No Known Allergies     Medication List    STOP taking these medications   HYDROcodone-acetaminophen 5-325 MG tablet Commonly known as: NORCO/VICODIN   ondansetron 4 MG disintegrating tablet Commonly known as: Zofran ODT     TAKE these medications   amoxicillin-clavulanate 875-125 MG tablet Commonly known as: Augmentin Take 1 tablet by mouth 2 (two) times daily for 10 days.   ibuprofen 600 MG tablet Commonly known as: ADVIL Take 1 tablet (600 mg total) by mouth every 8 (eight) hours as needed for mild pain or moderate pain.   oxyCODONE 5 MG immediate release tablet Commonly known as: Oxy IR/ROXICODONE Take 1 tablet (5 mg total) by mouth every 4 (four) hours as needed for severe pain.      Follow-up Information    Nikolina Simerson, Jacqulyn Bath, MD Follow up in 2 week(s).   Specialty: General Surgery Why: May do video/virtual appointment. Contact information: [address] [place] [address] [phone]

## 2019-03-24 ENCOUNTER — Other Ambulatory Visit: Payer: PRIVATE HEALTH INSURANCE

## 2019-03-25 ENCOUNTER — Other Ambulatory Visit: Payer: PRIVATE HEALTH INSURANCE

## 2019-03-29 ENCOUNTER — Encounter: Admission: RE | Payer: Self-pay | Source: Ambulatory Visit

## 2019-03-29 ENCOUNTER — Ambulatory Visit: Admission: RE | Admit: 2019-03-29 | Payer: PRIVATE HEALTH INSURANCE | Source: Ambulatory Visit | Admitting: Surgery

## 2019-03-29 SURGERY — LAPAROSCOPIC CHOLECYSTECTOMY
Anesthesia: General

## 2019-04-05 ENCOUNTER — Ambulatory Visit (INDEPENDENT_AMBULATORY_CARE_PROVIDER_SITE_OTHER): Payer: Self-pay | Admitting: Physician Assistant

## 2019-04-05 ENCOUNTER — Encounter: Payer: Self-pay | Admitting: Physician Assistant

## 2019-04-05 ENCOUNTER — Other Ambulatory Visit: Payer: Self-pay

## 2019-04-05 ENCOUNTER — Encounter: Payer: Self-pay | Admitting: Emergency Medicine

## 2019-04-05 VITALS — BP 122/73 | HR 99 | Temp 98.0°F | Resp 12 | Ht 70.5 in | Wt 253.4 lb

## 2019-04-05 DIAGNOSIS — K8020 Calculus of gallbladder without cholecystitis without obstruction: Secondary | ICD-10-CM

## 2019-04-05 DIAGNOSIS — Z09 Encounter for follow-up examination after completed treatment for conditions other than malignant neoplasm: Secondary | ICD-10-CM

## 2019-04-05 NOTE — Patient Instructions (Addendum)
You can take over the counter Imodium as needed for diarrhea.   GENERAL POST-OPERATIVE PATIENT INSTRUCTIONS   WOUND CARE INSTRUCTIONS:  Keep a dry clean dressing on the wound if there is drainage. The initial bandage may be removed after 24 hours.  Once the wound has quit draining you may leave it open to air.  If clothing rubs against the wound or causes irritation and the wound is not draining you may cover it with a dry dressing during the daytime.  Try to keep the wound dry and avoid ointments on the wound unless directed to do so.  If the wound becomes bright red and painful or starts to drain infected material that is not clear, please contact your physician immediately.  If the wound is mildly pink and has a thick firm ridge underneath it, this is normal, and is referred to as a healing ridge.  This will resolve over the next 4-6 weeks.  BATHING: You may shower if you have been informed of this by your surgeon. However, Please do not submerge in a tub, hot tub, or pool until incisions are completely sealed or have been told by your surgeon that you may do so.  DIET:  You may eat any foods that you can tolerate.  It is a good idea to eat a high fiber diet and take in plenty of fluids to prevent constipation.  If you do become constipated you may want to take a mild laxative or take ducolax tablets on a daily basis until your bowel habits are regular.  Constipation can be very uncomfortable, along with straining, after recent surgery.  ACTIVITY:  You are encouraged to cough and deep breath or use your incentive spirometer if you were given one, every 15-30 minutes when awake.  This will help prevent respiratory complications and low grade fevers post-operatively if you had a general anesthetic.  You may want to hug a pillow when coughing and sneezing to add additional support to the surgical area, if you had abdominal or chest surgery, which will decrease pain during these times.  You are encouraged  to walk and engage in light activity for the next two weeks.  You should not lift more than 20 pounds, until 04/19/2019 as it could put you at increased risk for complications.  Twenty pounds is roughly equivalent to a plastic bag of groceries. At that time- Listen to your body when lifting, if you have pain when lifting, stop and then try again in a few days. Soreness after doing exercises or activities of daily living is normal as you get back in to your normal routine.  MEDICATIONS:  Try to take narcotic medications and anti-inflammatory medications, such as tylenol, ibuprofen, naprosyn, etc., with food.  This will minimize stomach upset from the medication.  Should you develop nausea and vomiting from the pain medication, or develop a rash, please discontinue the medication and contact your physician.  You should not drive, make important decisions, or operate machinery when taking narcotic pain medication.  SUNBLOCK Use sun block to incision area over the next year if this area will be exposed to sun. This helps decrease scarring and will allow you avoid a permanent darkened area over your incision.  QUESTIONS:  Please feel free to call our office if you have any questions, and we will be glad to assist you.

## 2019-04-05 NOTE — Progress Notes (Signed)
[facility] POST-OP OFFICE VISIT  04/05/2019  HPI: Sheila Kemp is a 34 y.o. female 14 days s/p laparoscopic cholecystectomy with Dr Aleen Campi.   Overall doing well No issues with pain, nausea, or emesis Intermittent diarrhea with fatty foods Mobilizing well No new complaints  Vital signs: BP 122/73   Pulse 99   Temp 98 F (36.7 C) (Temporal)   Resp 12   Ht 5' 10.5" (1.791 m)   Wt 253 lb 6.4 oz (114.9 kg)   LMP 03/26/2019 (Approximate)   SpO2 97%   BMI 35.85 kg/m    Physical Exam: Constitutional: Well appearing female, NAD Abdomen: Soft, non-tender, non-distended, no rebound/guarding Skin: Laparoscopic incisions are CDI with dermabond, no erythema or drianage  Assessment/Plan: This is a 34 y.o. female 14 days s/p laparoscopic cholecystectomy.   - pain control prn  - reviewed post op care  - 2 more weeks lifting restrictions  - Imodium prn for diarrhea  - reviewed pathology; Early acute on chronic cholecystitis with cholelithiasis; negative for malignancy  - rtc prn; call with questions or concerns  -- Lynden Oxford, PA-C [facility] 04/05/2019, 10:38 AM [phone] M-F: 7am - 4pm

## 2019-06-16 ENCOUNTER — Encounter: Payer: Self-pay | Admitting: Emergency Medicine

## 2019-06-16 ENCOUNTER — Other Ambulatory Visit: Payer: Self-pay

## 2019-06-16 ENCOUNTER — Ambulatory Visit
Admission: EM | Admit: 2019-06-16 | Discharge: 2019-06-16 | Disposition: A | Discharge status: Home / Self Care | Payer: PRIVATE HEALTH INSURANCE | Attending: Family Medicine | Admitting: Family Medicine

## 2019-06-16 DIAGNOSIS — J309 Allergic rhinitis, unspecified: Secondary | ICD-10-CM

## 2019-06-16 DIAGNOSIS — F17200 Nicotine dependence, unspecified, uncomplicated: Secondary | ICD-10-CM | POA: Insufficient documentation

## 2019-06-16 DIAGNOSIS — Z20822 Contact with and (suspected) exposure to covid-19: Secondary | ICD-10-CM | POA: Insufficient documentation

## 2019-06-16 DIAGNOSIS — R0981 Nasal congestion: Secondary | ICD-10-CM | POA: Diagnosis not present

## 2019-06-16 DIAGNOSIS — R067 Sneezing: Secondary | ICD-10-CM | POA: Insufficient documentation

## 2019-06-16 DIAGNOSIS — R519 Headache, unspecified: Secondary | ICD-10-CM | POA: Insufficient documentation

## 2019-06-16 LAB — SARS CORONAVIRUS 2 (TAT 6-24 HRS): SARS Coronavirus 2: NEGATIVE

## 2019-06-16 MED ORDER — FLUTICASONE PROPIONATE 50 MCG/ACT NA SUSP: 2.0000 | Freq: Every day | NASAL | 0 refills | Status: DC

## 2019-06-16 NOTE — Discharge Instructions (Addendum)
Watch this video who to do saline nose rinses.  [email or web address]   Do not use tap water, only boiled water that has been cooled down.  Do it twice a day  for 5-7 days, but avoid bed time.   STAY QUARANTINED UNTIL YOU TEST IS BACK

## 2019-06-16 NOTE — ED Triage Notes (Signed)
Patient c/o sinus drainage and pressure that started on Monday. Denies fever, denies cough.

## 2019-06-16 NOTE — ED Provider Notes (Signed)
[facility]    CSN: [id number] Arrival date & time: 06/16/19  0913      History   Chief Complaint Chief Complaint  Patient presents with  . Facial Pain  . Nasal Congestion    HPI Sheila Kemp is a 34 y.o. female. who present with onset of rhinitis and stuffy nose. And started taking Mucinexs. Sinus was not draining at all on day 2, but now is draining a lot x 2 days. Denies body aches or fever.  Has been having itching on the roof of her mouth and she has been sneezing. Denies itchy or watery eyes or ears.   History reviewed. No pertinent past medical history.  Patient Active Problem List   Diagnosis Date Noted  . Acute cholecystitis 03/21/2019    Past Surgical History:  Procedure Laterality Date  . CHOLECYSTECTOMY N/A 03/22/2019   Procedure: LAPAROSCOPIC CHOLECYSTECTOMY;  Surgeon: Henrene Dodge, MD;  Location: [facility] ORS;  Service: General;  Laterality: N/A;  . ECTOPIC PREGNANCY SURGERY    . LASER ABLATION      OB History   No obstetric history on file.      Home Medications    Prior to Admission medications   Medication Sig Start Date End Date Taking? Authorizing Provider  ibuprofen (ADVIL) 600 MG tablet Take 1 tablet (600 mg total) by mouth every 8 (eight) hours as needed for mild pain or moderate pain. 03/23/19   Henrene Dodge, MD    Family History History reviewed. No pertinent family history.  Social History Social History   Tobacco Use  . Smoking status: Current Every Day Smoker  . Smokeless tobacco: Never Used  Substance Use Topics  . Alcohol use: Yes  . Drug use: No     Allergies   Patient has no known allergies.   Review of Systems Review of Systems  Constitutional: Negative for chills, fatigue and fever.  HENT: Positive for congestion, postnasal drip and rhinorrhea. Negative for sore throat.        The roof of her throat has been itching  Eyes: Negative for discharge and itching.  Respiratory: Negative for cough,  shortness of breath and wheezing.   Cardiovascular: Negative for chest pain.  Musculoskeletal: Negative for gait problem and myalgias.  Skin: Negative for rash.  Neurological: Negative for headaches.  Hematological: Negative for adenopathy.   Physical Exam Triage Vital Signs ED Triage Vitals  Enc Vitals Group     BP 06/16/19 0947 114/80     Pulse Rate 06/16/19 0947 72     Resp 06/16/19 0947 18     Temp 06/16/19 0947 98.4 F (36.9 C)     Temp Source 06/16/19 0947 Oral     SpO2 06/16/19 0947 100 %     Weight 06/16/19 0945 255 lb (115.7 kg)     Height 06/16/19 0945 5\' 10"  (1.778 m)     Head Circumference --      Peak Flow --      Pain Score 06/16/19 0945 0     Pain Loc --      Pain Edu? --      Excl. in GC? --    No data found.  Updated Vital Signs BP 114/80 (BP Location: Right Arm)   Pulse 72   Temp 98.4 F (36.9 C) (Oral)   Resp 18   Ht 5\' 10"  (1.778 m)   Wt 255 lb (115.7 kg)   LMP 05/23/2019   SpO2 100%   BMI 36.59  kg/m   Visual Acuity Right Eye Distance:   Left Eye Distance:   Bilateral Distance:    Right Eye Near:   Left Eye Near:    Bilateral Near:     Physical Exam Physical Exam Vitals signs and nursing note reviewed.  Constitutional:      General: She is not in acute distress.    Appearance: Normal appearance. She is not ill-appearing, toxic-appearing or diaphoretic.  HENT:     Head: Normocephalic.     Right Ear: Tympanic membrane, ear canal and external ear normal.     Left Ear: Tympanic membrane, ear canal and external ear normal.     Nose: Nose with moderate mucosa congestion, clear mucous.     Mouth/Throat:     Mouth: Mucous membranes are moist.  Eyes:     General: No scleral icterus.       Right eye: No discharge.        Left eye: No discharge.     Conjunctiva/sclera: Conjunctivae normal.  Neck:     Musculoskeletal: Neck supple. No neck rigidity.  Cardiovascular:     Rate and Rhythm: Normal rate and regular rhythm.     Heart sounds:  No murmur.  Pulmonary:     Effort: Pulmonary effort is normal.     Breath sounds: Normal breath sounds.  Abdominal:     General: Bowel sounds are normal. There is no distension.     Palpations: Abdomen is soft. There is no mass.     Tenderness: There is no abdominal tenderness. There is no guarding or rebound.     Hernia: No hernia is present.  Musculoskeletal: Normal range of motion.  Lymphadenopathy:     Cervical: No cervical adenopathy.  Skin:    General: Skin is warm and dry.     Coloration: Skin is not jaundiced.     Findings: No rash.  Neurological:     Mental Status: She is alert and oriented to person, place, and time.     Gait: Gait normal.  Psychiatric:        Mood and Affect: Mood normal.        Behavior: Behavior normal.        Thought Content: Thought content normal.        Judgment: Judgment normal.   UC Treatments / Results  Labs (all labs ordered are listed, but only abnormal results are displayed) Labs Reviewed  SARS CORONAVIRUS 2 (TAT 6-24 HRS)   EKG  Radiology No results found.  Procedures Procedures (including critical care time)  Medications Ordered in UC Medications - No data to display  Initial Impression / Assessment and Plan / UC Course  I have reviewed the triage vital signs and the nursing notes. Advised to do saline rinses and I placed her on Flonase.  We will inform her when her COVID test is back. Needs to stay quarantined in the mean time.  Final Clinical Impressions(s) / UC Diagnoses   Final diagnoses:  None   Discharge Instructions   None    ED Prescriptions    None     PDMP not reviewed this encounter.   Shelby Mattocks, PA-C 06/16/19 1038

## 2019-06-17 ENCOUNTER — Telehealth: Payer: Self-pay | Admitting: Emergency Medicine

## 2019-06-17 NOTE — Telephone Encounter (Signed)
Patient called wanting her test result.  Name and DOB were verified.  Patient was notified that her COVID test was Negative.  Patient verbalized understanding.

## 2019-07-29 ENCOUNTER — Ambulatory Visit
Admission: EM | Admit: 2019-07-29 | Discharge: 2019-07-29 | Disposition: A | Discharge status: Home / Self Care | Payer: PRIVATE HEALTH INSURANCE | Attending: Urgent Care | Admitting: Urgent Care

## 2019-07-29 ENCOUNTER — Other Ambulatory Visit: Payer: Self-pay

## 2019-07-29 DIAGNOSIS — Z111 Encounter for screening for respiratory tuberculosis: Secondary | ICD-10-CM | POA: Diagnosis not present

## 2019-07-29 MED ORDER — TUBERCULIN PPD 5 UNIT/0.1ML ID SOLN: 5.0000 [IU] | Freq: Once | INTRADERMAL | Status: DC

## 2019-07-29 NOTE — ED Triage Notes (Signed)
PPD Placement note Sheila Kemp, 34 y.o. female is here today for placement of PPD test Reason for PPD test: employment Pt taken PPD test before: no Verified in allergy area and with patient that they are not allergic to the products PPD is made of (Phenol or Tween). Yes Is patient taking any oral or IV steroid medication now or have they taken it in the last month? no Has the patient ever received the BCG vaccine?: no Has the patient been in recent contact with anyone known or suspected of having active TB disease?: no      Date of exposure (if applicable): n/a      Name of person they were exposed to (if applicable): n/a Patient's Country of origin?: [place] O: Alert and oriented in NAD. P:  PPD placed on 07/29/2019 on left inner forearm.  Patient advised to return for reading within 48-72 hours.

## 2019-07-29 NOTE — ED Triage Notes (Signed)
Pt presents for placement of TB skin test for employment. Pt denies any history of positive TB skin test.

## 2019-08-01 ENCOUNTER — Ambulatory Visit
Admission: EM | Admit: 2019-08-01 | Discharge: 2019-08-01 | Disposition: A | Discharge status: Home / Self Care | Payer: PRIVATE HEALTH INSURANCE

## 2019-08-01 ENCOUNTER — Other Ambulatory Visit: Payer: Self-pay

## 2019-08-01 NOTE — ED Triage Notes (Signed)
Patient here for PPD read. 84mm Negative

## 2020-03-15 ENCOUNTER — Other Ambulatory Visit: Payer: Self-pay

## 2020-03-15 ENCOUNTER — Emergency Department
Admission: EM | Admit: 2020-03-15 | Discharge: 2020-03-15 | Disposition: A | Discharge status: Home / Self Care | Payer: HRSA Program | Attending: Emergency Medicine | Admitting: Emergency Medicine

## 2020-03-15 ENCOUNTER — Encounter: Payer: Self-pay | Admitting: Emergency Medicine

## 2020-03-15 DIAGNOSIS — F17200 Nicotine dependence, unspecified, uncomplicated: Secondary | ICD-10-CM | POA: Diagnosis not present

## 2020-03-15 DIAGNOSIS — U071 COVID-19: Secondary | ICD-10-CM | POA: Diagnosis not present

## 2020-03-15 DIAGNOSIS — R509 Fever, unspecified: Secondary | ICD-10-CM | POA: Diagnosis present

## 2020-03-15 DIAGNOSIS — B349 Viral infection, unspecified: Secondary | ICD-10-CM

## 2020-03-15 LAB — RESP PANEL BY RT-PCR (FLU A&B, COVID) ARPGX2
Influenza A by PCR: NEGATIVE
Influenza B by PCR: NEGATIVE
SARS Coronavirus 2 by RT PCR: POSITIVE — AB

## 2020-03-15 MED ORDER — PSEUDOEPH-BROMPHEN-DM 30-2-10 MG/5ML PO SYRP: 10.0000 mL | ORAL_SOLUTION | Freq: Four times a day (QID) | ORAL | 0 refills | Status: DC | PRN

## 2020-03-15 NOTE — ED Provider Notes (Signed)
[facility] Emergency Department Provider Note  ____________________________________________  Time seen: Approximately 6:05 PM  I have reviewed the triage vital signs and the nursing notes.   HISTORY  Chief Complaint Generalized Body Aches and Chills    HPI Sheila Kemp is a 34 y.o. female who presents the emergency department complaining of body aches, chills and fever as well as nasal congestion starting last night.  No recent sick contacts according to the patient.  No headache, neck pain or stiffness, chest pain, shortness of breath abdominal pain, nausea vomiting, diarrhea or constipation.  Patient is taken DayQuil today.  No other medications.  No other complaints at this time.         History reviewed. No pertinent past medical history.  Patient Active Problem List   Diagnosis Date Noted  . Acute cholecystitis 03/21/2019    Past Surgical History:  Procedure Laterality Date  . CHOLECYSTECTOMY N/A 03/22/2019   Procedure: LAPAROSCOPIC CHOLECYSTECTOMY;  Surgeon: Henrene Dodge, MD;  Location: [facility] ORS;  Service: General;  Laterality: N/A;  . ECTOPIC PREGNANCY SURGERY    . LASER ABLATION      Prior to Admission medications   Medication Sig Start Date End Date Taking? Authorizing Provider  brompheniramine-pseudoephedrine-DM 30-2-10 MG/5ML syrup Take 10 mLs by mouth 4 (four) times daily as needed. 03/15/20   Golden Emile, Delorise Royals, PA-C  fluticasone (FLONASE) 50 MCG/ACT nasal spray Place 2 sprays into both nostrils daily for 7 days. 06/16/19 06/23/19  Rodriguez-Southworth, Nettie Elm, PA-C    Allergies Patient has no known allergies.  History reviewed. No pertinent family history.  Social History Social History   Tobacco Use  . Smoking status: Current Every Day Smoker  . Smokeless tobacco: Never Used  Substance Use Topics  . Alcohol use: Yes  . Drug use: No     Review of Systems  Constitutional: Positive fever/chills.  Positive for  body aches Eyes: No visual changes. No discharge ENT: Positive for nasal congestion Cardiovascular: no chest pain. Respiratory: no cough. No SOB. Gastrointestinal: No abdominal pain.  No nausea, no vomiting.  No diarrhea.  No constipation. Genitourinary: Negative for dysuria. No hematuria Musculoskeletal: Negative for musculoskeletal pain. Skin: Negative for rash, abrasions, lacerations, ecchymosis. Neurological: Negative for headaches, focal weakness or numbness.  10 System ROS otherwise negative.  ____________________________________________   PHYSICAL EXAM:  VITAL SIGNS: ED Triage Vitals  Enc Vitals Group     BP 03/15/20 1625 117/65     Pulse Rate 03/15/20 1625 98     Resp 03/15/20 1625 18     Temp 03/15/20 1625 100.3 F (37.9 C)     Temp Source 03/15/20 1625 Oral     SpO2 03/15/20 1625 97 %     Weight 03/15/20 1622 250 lb (113.4 kg)     Height 03/15/20 1622 5\' 10"  (1.778 m)     Head Circumference --      Peak Flow --      Pain Score 03/15/20 1622 10     Pain Loc --      Pain Edu? --      Excl. in GC? --      Constitutional: Alert and oriented. Well appearing and in no acute distress. Eyes: Conjunctivae are normal. PERRL. EOMI. Head: Atraumatic. ENT:      Ears: EACs and TMs unremarkable bilaterally.      Nose: Moderate purulent congestion/rhinnorhea.      Mouth/Throat: Mucous membranes are moist.  Oropharynx is nonerythematous and nonedematous.  Uvula is  midline. Neck: No stridor.  Neck is supple Fornage motion Hematological/Lymphatic/Immunilogical: No cervical lymphadenopathy. Cardiovascular: Normal rate, regular rhythm. Normal S1 and S2.  Good peripheral circulation. Respiratory: Normal respiratory effort without tachypnea or retractions. Lungs CTAB. Good air entry to the bases with no decreased or absent breath sounds. Gastrointestinal: Bowel sounds 4 quadrants. Soft and nontender to palpation. No guarding or rigidity. No palpable masses. No distention.   Musculoskeletal: Full range of motion to all extremities. No gross deformities appreciated. Neurologic:  Normal speech and language. No gross focal neurologic deficits are appreciated.  Skin:  Skin is warm, dry and intact. No rash noted. Psychiatric: Mood and affect are normal. Speech and behavior are normal. Patient exhibits appropriate insight and judgement.   ____________________________________________   LABS (all labs ordered are listed, but only abnormal results are displayed)  Labs Reviewed  RESP PANEL BY RT-PCR (FLU A&B, COVID) [id number]   ____________________________________________  EKG   ____________________________________________  RADIOLOGY   No results found.  ____________________________________________    PROCEDURES  Procedure(s) performed:    Procedures    Medications - No data to display   ____________________________________________   INITIAL IMPRESSION / ASSESSMENT AND PLAN / ED COURSE  Pertinent labs & imaging results that were available during my care of the patient were reviewed by me and considered in my medical decision making (see chart for details).  Review of the [place] CSRS was performed in accordance of the [facility] prior to dispensing any controlled drugs.           Patient's diagnosis is consistent with viral illness.  Patient presented to emergency department complaining of viral URI symptoms x2 days.  Overall exam is reassuring.  Patient with Covid test here in the ED.  No indication for other labs or imaging..  Patient will be treated with Bromfed cough syrup.  Tylenol Motrin at home.  Plenty fluids and rest.  Follow-up with primary care as needed.  Patient is given ED precautions to return to the ED for any worsening or new symptoms.     ____________________________________________  FINAL CLINICAL IMPRESSION(S) / ED DIAGNOSES  Final diagnoses:  Viral illness      NEW MEDICATIONS STARTED DURING THIS VISIT:  ED  Discharge Orders         Ordered    brompheniramine-pseudoephedrine-DM 30-2-10 MG/5ML syrup  4 times daily PRN        03/15/20 1818              This chart was dictated using voice recognition software/Dragon. Despite best efforts to proofread, errors can occur which can change the meaning. Any change was purely unintentional.    Racheal Patches, PA-C 03/15/20 1819    Phineas Semen, MD 03/15/20 [phone]

## 2020-03-15 NOTE — ED Triage Notes (Signed)
Pt arrived via POV with reports of chills, subjective fevers and body aches that started last night.  Pt taking Dayquil last had dose at 745am, no other medications since.

## 2020-03-16 NOTE — ED Notes (Signed)
Returned patient phone call to give her results of COVID and Flu results.  Informed COVID test positive and flu test negative.  Understand ing verbalized.

## 2020-03-18 ENCOUNTER — Telehealth (HOSPITAL_COMMUNITY): Payer: Self-pay | Admitting: Adult Health

## 2020-03-18 NOTE — Telephone Encounter (Signed)
Called patient regarding monoclonal antibody treatment for COVID 19 given to those who are at risk for complications and/or hospitalization of the virus. Although she has BMI of 35 and is a current smoker, she has no further symptoms, and therefore doesn't meet criteria for treatment at this time.  Her Sx onset was on 03/14/2020.  Lillard Anes, NP

## 2020-08-09 ENCOUNTER — Other Ambulatory Visit: Payer: Self-pay

## 2020-08-09 ENCOUNTER — Encounter: Payer: Self-pay | Admitting: Emergency Medicine

## 2020-08-09 ENCOUNTER — Ambulatory Visit
Admission: EM | Admit: 2020-08-09 | Discharge: 2020-08-09 | Disposition: A | Discharge status: Home / Self Care | Payer: 59 | Attending: Emergency Medicine | Admitting: Emergency Medicine

## 2020-08-09 DIAGNOSIS — L03012 Cellulitis of left finger: Secondary | ICD-10-CM

## 2020-08-09 MED ORDER — DOXYCYCLINE HYCLATE 100 MG PO CAPS: 100.0000 mg | ORAL_CAPSULE | Freq: Two times a day (BID) | ORAL | 0 refills | Status: AC

## 2020-08-09 MED ORDER — IBUPROFEN 600 MG PO TABS: 600.0000 mg | ORAL_TABLET | Freq: Four times a day (QID) | ORAL | 0 refills | Status: DC | PRN

## 2020-08-09 MED ORDER — HIBICLENS 4 % EX LIQD: Freq: Every day | CUTANEOUS | 0 refills | Status: DC | PRN

## 2020-08-09 NOTE — Discharge Instructions (Addendum)
Warm soaks, keep clean with Hibiclens.  Finish doxycycline, even if you feel better.  Take 600 mg of ibuprofen combined with 1000 mg of Tylenol together 3-4 times a day as needed for pain.

## 2020-08-09 NOTE — ED Triage Notes (Signed)
Patient c/o left thumb pain that started 2 days ago. She denies injury.

## 2020-08-09 NOTE — ED Provider Notes (Signed)
HPI  SUBJECTIVE:  Sheila Kemp is a right-handed 35 y.o. female who presents with 2 days of constant, throbbing left thumb pain along the radial aspect of the thumb, swelling most pronounced at the finger pad.  She reports erythema and the pain worsening today.  No fevers, body aches, drainage, trauma.  She does not bite her nails.  No numbness or tingling.  She reports limitation of motion secondary to the swelling.  No alleviating factors, she has not tried anything for this.  Symptoms are worse with palpation.  Past medical history negative for MRSA, diabetes, hypertension, HIV, immunocompromise, smoking.  LMP: Last week.  Denies the possibility being pregnant.  PMD: None.   History reviewed. No pertinent past medical history.  Past Surgical History:  Procedure Laterality Date  . CHOLECYSTECTOMY N/A 03/22/2019   Procedure: LAPAROSCOPIC CHOLECYSTECTOMY;  Surgeon: Henrene Dodge, MD;  Location: [facility] ORS;  Service: General;  Laterality: N/A;  . ECTOPIC PREGNANCY SURGERY    . LASER ABLATION      History reviewed. No pertinent family history.  Social History   Tobacco Use  . Smoking status: Former Games developer  . Smokeless tobacco: Never Used  Vaping Use  . Vaping Use: Never used  Substance Use Topics  . Alcohol use: Yes  . Drug use: No    No current facility-administered medications for this encounter.  Current Outpatient Medications:  .  chlorhexidine (HIBICLENS) 4 % external liquid, Apply topically daily as needed. Dilute 10-15 mL in water, Use daily when bathing for 1-2 weeks, Disp: 120 mL, Rfl: 0 .  doxycycline (VIBRAMYCIN) 100 MG capsule, Take 1 capsule (100 mg total) by mouth 2 (two) times daily for 5 days., Disp: 10 capsule, Rfl: 0 .  ibuprofen (ADVIL) 600 MG tablet, Take 1 tablet (600 mg total) by mouth every 6 (six) hours as needed., Disp: 30 tablet, Rfl: 0  No Known Allergies   ROS  As noted in HPI.   Physical Exam  BP 104/72 (BP Location: Right Arm)    Pulse 66   Temp 98.7 F (37.1 C) (Oral)   Resp 18   Ht 5\' 10"  (1.778 m)   Wt 113.4 kg   LMP 08/02/2020   SpO2 100%   BMI 35.87 kg/m   Constitutional: Well developed, well nourished, no acute distress Eyes:  EOMI, conjunctiva normal bilaterally HENT: Normocephalic, atraumatic,mucus membranes moist Respiratory: Normal inspiratory effort Cardiovascular: Normal rate GI: nondistended skin: No rash, skin intact Musculoskeletal: Tender erythema, swelling radial aspect of left thumb and finger pad.  Sensation light touch and temperature grossly intact.  No tenderness along the flexor tendon.  No evidence of trauma to the finger.  Limited flexion due to edema.        Neurologic: Alert & oriented x 3, no focal neuro deficits Psychiatric: Speech and behavior appropriate   ED Course   Medications - No data to display  No orders of the defined types were placed in this encounter.   No results found for this or any previous visit (from the past 24 hour(s)). No results found.  ED Clinical Impression  1. Cellulitis of finger of left hand      ED Assessment/Plan  No obvious paronychia to drain.  Deferring I&D today.  She has a cellulitis, may even have an early felon.  No evidence of flexor tenosynovitis. will send home with warm soaks, doxycycline, Tylenol/ibuprofen together 3-4 times a day.  Will come back if it turns into a paronychia.  If it  continues to get worse and does not turn into an obvious paronychia she will need to follow-up with hand surgery/Dr. Mathis Bud at [facility].  Discussed MDM, treatment plan, and plan for follow-up with patient. . patient agrees with plan.   Meds ordered this encounter  Medications  . ibuprofen (ADVIL) 600 MG tablet    Sig: Take 1 tablet (600 mg total) by mouth every 6 (six) hours as needed.    Dispense:  30 tablet    Refill:  0  . chlorhexidine (HIBICLENS) 4 % external liquid    Sig: Apply topically daily as needed. Dilute  10-15 mL in water, Use daily when bathing for 1-2 weeks    Dispense:  120 mL    Refill:  0  . doxycycline (VIBRAMYCIN) 100 MG capsule    Sig: Take 1 capsule (100 mg total) by mouth 2 (two) times daily for 5 days.    Dispense:  10 capsule    Refill:  0      *This clinic note was created using Scientist, clinical (histocompatibility and immunogenetics). Therefore, there may be occasional mistakes despite careful proofreading.  ?    Domenick Gong, MD 08/10/20 [phone]

## 2020-11-08 ENCOUNTER — Ambulatory Visit
Admission: EM | Admit: 2020-11-08 | Discharge: 2020-11-08 | Disposition: A | Discharge status: Home / Self Care | Payer: 59 | Attending: Family Medicine | Admitting: Family Medicine

## 2020-11-08 ENCOUNTER — Other Ambulatory Visit: Payer: Self-pay

## 2020-11-08 ENCOUNTER — Encounter: Payer: Self-pay | Admitting: Emergency Medicine

## 2020-11-08 DIAGNOSIS — K529 Noninfective gastroenteritis and colitis, unspecified: Secondary | ICD-10-CM

## 2020-11-08 MED ORDER — DIPHENOXYLATE-ATROPINE 2.5-0.025 MG PO TABS: 1.0000 | ORAL_TABLET | Freq: Four times a day (QID) | ORAL | 0 refills | Status: DC | PRN

## 2020-11-08 NOTE — ED Provider Notes (Signed)
[facility]    CSN: [id number] Arrival date & time: 11/08/20  1415      History   Chief Complaint Chief Complaint  Patient presents with   Diarrhea   HPI 35 year old female presents with diarrhea.  Started on Tuesday.  Patient reports that she is having diarrhea.  Occurs primarily after she eats or shortly after.  She reports some left-sided lower abdominal pain.  She is also had a headache.  No fever.  No relieving factors.  Declines COVID testing today.  No other associated symptoms.    Patient Active Problem List   Diagnosis Date Noted   Acute cholecystitis 03/21/2019   Past Surgical History:  Procedure Laterality Date   CHOLECYSTECTOMY N/A 03/22/2019   Procedure: LAPAROSCOPIC CHOLECYSTECTOMY;  Surgeon: Henrene Dodge, MD;  Location: [facility] ORS;  Service: General;  Laterality: N/A;   ECTOPIC PREGNANCY SURGERY     LASER ABLATION      OB History   No obstetric history on file.    Home Medications    Prior to Admission medications   Medication Sig Start Date End Date Taking? Authorizing Provider  diphenoxylate-atropine (LOMOTIL) 2.5-0.025 MG tablet Take 1 tablet by mouth 4 (four) times daily as needed for diarrhea or loose stools. 11/08/20  Yes Honora Searson G, DO  fluticasone (FLONASE) 50 MCG/ACT nasal spray Place 2 sprays into both nostrils daily for 7 days. 06/16/19 08/09/20  Rodriguez-Southworth, Nettie Elm, PA-C   Social History Social History   Tobacco Use   Smoking status: Former   Smokeless tobacco: Never  Building services engineer Use: Never used  Substance Use Topics   Alcohol use: Yes   Drug use: No     Allergies   Patient has no known allergies.   Review of Systems Review of Systems  Constitutional:  Negative for fever.  Gastrointestinal:  Positive for diarrhea.   Physical Exam Triage Vital Signs ED Triage Vitals  Enc Vitals Group     BP 11/08/20 1458 107/62     Pulse Rate 11/08/20 1457 68     Resp 11/08/20 1457 16     Temp 11/08/20 1457  98.8 F (37.1 C)     Temp Source 11/08/20 1457 Oral     SpO2 11/08/20 1457 100 %     Weight --      Height --      Head Circumference --      Peak Flow --      Pain Score 11/08/20 1455 5     Pain Loc --      Pain Edu? --      Excl. in GC? --    Updated Vital Signs BP 107/62   Pulse 68   Temp 98.8 F (37.1 C) (Oral)   Resp 16   LMP 10/27/2020   SpO2 100%   Visual Acuity Right Eye Distance:   Left Eye Distance:   Bilateral Distance:    Right Eye Near:   Left Eye Near:    Bilateral Near:     Physical Exam Vitals and nursing note reviewed.  Constitutional:      General: She is not in acute distress.    Appearance: Normal appearance. She is not ill-appearing.  HENT:     Head: Normocephalic and atraumatic.  Eyes:     General:        Right eye: No discharge.        Left eye: No discharge.     Conjunctiva/sclera: Conjunctivae normal.  Cardiovascular:  Rate and Rhythm: Normal rate and regular rhythm.     Heart sounds: No murmur heard. Pulmonary:     Effort: Pulmonary effort is normal.     Breath sounds: Normal breath sounds. No wheezing, rhonchi or rales.  Neurological:     Mental Status: She is alert.  Psychiatric:        Mood and Affect: Mood normal.        Behavior: Behavior normal.   UC Treatments / Results  Labs (all labs ordered are listed, but only abnormal results are displayed) Labs Reviewed - No data to display  EKG   Radiology No results found.  Procedures Procedures (including critical care time)  Medications Ordered in UC Medications - No data to display  Initial Impression / Assessment and Plan / UC Course  I have reviewed the triage vital signs and the nursing notes.  Pertinent labs & imaging results that were available during my care of the patient were reviewed by me and considered in my medical decision making (see chart for details).    35 year old female presents with suspected gastroenteritis.  She is having ongoing  diarrhea.  She is well-appearing.  She declines COVID testing.  Lomotil as prescribed.  Push fluids.  Final Clinical Impressions(s) / UC Diagnoses   Final diagnoses:  Gastroenteritis     Discharge Instructions      Lots of fluids.  Rest.  Medication as prescribed.   If you worsen or fail to improve, please let us know.     ED Prescriptions     Medication Sig Dispense Auth. Provider   diphenoxylate-atropine (LOMOTIL) 2.5-0.025 MG tablet Take 1 tablet by mouth 4 (four) times daily as needed for diarrhea or loose stools. 30 tablet Tommie Sams, DO      PDMP not reviewed this encounter.   Tommie Sams, [place] 11/08/20 2033

## 2020-11-08 NOTE — Discharge Instructions (Addendum)
Lots of fluids.  Rest.  Medication as prescribed.   If you worsen or fail to improve, please let us know.

## 2020-11-08 NOTE — ED Triage Notes (Signed)
Diarrhea since Tuesday. Every time she eats or drinks she has an episode shortly after. Left side pain started today. Endorses a headache that started yesterday.   Denies sore throat, cough, fever.

## 2021-05-02 ENCOUNTER — Emergency Department
Admission: EM | Admit: 2021-05-02 | Discharge: 2021-05-02 | Disposition: A | Discharge status: Home / Self Care | Payer: 59 | Attending: Emergency Medicine | Admitting: Emergency Medicine

## 2021-05-02 ENCOUNTER — Emergency Department: Payer: 59

## 2021-05-02 DIAGNOSIS — M25512 Pain in left shoulder: Secondary | ICD-10-CM | POA: Diagnosis present

## 2021-05-02 DIAGNOSIS — M542 Cervicalgia: Secondary | ICD-10-CM | POA: Insufficient documentation

## 2021-05-02 DIAGNOSIS — T148XXA Other injury of unspecified body region, initial encounter: Secondary | ICD-10-CM

## 2021-05-02 DIAGNOSIS — Y92410 Unspecified street and highway as the place of occurrence of the external cause: Secondary | ICD-10-CM | POA: Insufficient documentation

## 2021-05-02 DIAGNOSIS — M7918 Myalgia, other site: Secondary | ICD-10-CM

## 2021-05-02 DIAGNOSIS — S40212A Abrasion of left shoulder, initial encounter: Secondary | ICD-10-CM | POA: Diagnosis not present

## 2021-05-02 MED ORDER — CYCLOBENZAPRINE HCL 10 MG PO TABS
10.0000 mg | ORAL_TABLET | Freq: Once | ORAL | Status: AC
  Administered 2021-05-02: 10 mg via ORAL
  Filled 2021-05-02: qty 1

## 2021-05-02 MED ORDER — NAPROXEN 500 MG PO TABS: 500.0000 mg | ORAL_TABLET | Freq: Two times a day (BID) | ORAL | 0 refills | Status: AC

## 2021-05-02 MED ORDER — NAPROXEN 500 MG PO TABS
500.0000 mg | ORAL_TABLET | Freq: Once | ORAL | Status: AC
  Administered 2021-05-02: 500 mg via ORAL
  Filled 2021-05-02: qty 1

## 2021-05-02 MED ORDER — CYCLOBENZAPRINE HCL 5 MG PO TABS: 5.0000 mg | ORAL_TABLET | Freq: Three times a day (TID) | ORAL | 0 refills | Status: DC | PRN
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
## 2021-05-02 NOTE — ED Notes (Signed)
See triage note. Pt denies hitting head. A&Ox4. Resp reg/unlabored. Skin dry. In NAD.

## 2021-05-02 NOTE — Discharge Instructions (Addendum)
You should take the prescription meds as directed. Apply ice and/or moist heat to reduce swelling and pain. Apply Vaseline to the abrasion to promote healing.
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
## 2021-05-21 ENCOUNTER — Ambulatory Visit (LOCAL_COMMUNITY_HEALTH_CENTER): Payer: Self-pay

## 2021-05-21 ENCOUNTER — Other Ambulatory Visit: Payer: Self-pay

## 2021-05-21 DIAGNOSIS — Z111 Encounter for screening for respiratory tuberculosis: Secondary | ICD-10-CM

## 2021-05-24 ENCOUNTER — Ambulatory Visit (LOCAL_COMMUNITY_HEALTH_CENTER): Payer: Self-pay

## 2021-05-24 ENCOUNTER — Other Ambulatory Visit: Payer: Self-pay

## 2021-05-24 DIAGNOSIS — Z111 Encounter for screening for respiratory tuberculosis: Secondary | ICD-10-CM

## 2021-05-24 LAB — TB SKIN TEST
Induration: 0 mm
TB Skin Test: NEGATIVE

## 2021-08-20 ENCOUNTER — Encounter: Payer: Self-pay | Admitting: Emergency Medicine

## 2021-08-20 ENCOUNTER — Ambulatory Visit: Admission: EM | Admit: 2021-08-20 | Discharge: 2021-08-20 | Payer: 59 | Attending: Student | Admitting: Student

## 2021-08-20 ENCOUNTER — Other Ambulatory Visit: Payer: Self-pay

## 2021-08-20 DIAGNOSIS — R109 Unspecified abdominal pain: Secondary | ICD-10-CM | POA: Insufficient documentation

## 2021-08-20 DIAGNOSIS — N83202 Unspecified ovarian cyst, left side: Secondary | ICD-10-CM | POA: Insufficient documentation

## 2021-08-20 DIAGNOSIS — D72829 Elevated white blood cell count, unspecified: Secondary | ICD-10-CM | POA: Insufficient documentation

## 2021-08-20 LAB — COMPREHENSIVE METABOLIC PANEL
ALT: 14 U/L (ref 0–44)
AST: 19 U/L (ref 15–41)
Albumin: 4.3 g/dL (ref 3.5–5.0)
Alkaline Phosphatase: 36 U/L — ABNORMAL LOW (ref 38–126)
Anion gap: 11 (ref 5–15)
BUN: 12 mg/dL (ref 6–20)
CO2: 24 mmol/L (ref 22–32)
Calcium: 9.4 mg/dL (ref 8.9–10.3)
Chloride: 102 mmol/L (ref 98–111)
Creatinine, Ser: 0.84 mg/dL (ref 0.44–1.00)
GFR, Estimated: >60 mL/min (ref >60)
Glucose, Bld: 94 mg/dL (ref 70–99)
Potassium: 4.2 mmol/L (ref 3.5–5.1)
Sodium: 137 mmol/L (ref 135–145)
Total Bilirubin: 1.4 mg/dL — ABNORMAL HIGH (ref 0.3–1.2)
Total Protein: 7.8 g/dL (ref 6.5–8.1)

## 2021-08-20 LAB — URINALYSIS, ROUTINE W REFLEX MICROSCOPIC
Glucose, UA: NEGATIVE mg/dL
Hgb urine dipstick: NEGATIVE
Ketones, ur: 15 mg/dL — AB
Leukocytes,Ua: NEGATIVE
Nitrite: NEGATIVE
Protein, ur: 30 mg/dL — AB
Specific Gravity, Urine: >1.03 — ABNORMAL HIGH (ref 1.005–1.030)
pH: 5.5 (ref 5.0–8.0)

## 2021-08-20 LAB — CBC
HCT: 40.7 % (ref 36.0–46.0)
Hemoglobin: 12.6 g/dL (ref 12.0–15.0)
MCH: 28.8 pg (ref 26.0–34.0)
MCHC: 31 g/dL (ref 30.0–36.0)
MCV: 92.9 fL (ref 80.0–100.0)
Platelets: 274 10*3/uL (ref 150–400)
RBC: 4.38 MIL/uL (ref 3.87–5.11)
RDW: 12.5 % (ref 11.5–15.5)
WBC: 16.1 10*3/uL — ABNORMAL HIGH (ref 4.0–10.5)
nRBC: 0 % (ref 0.0–0.2)

## 2021-08-20 LAB — PREGNANCY, URINE: Preg Test, Ur: NEGATIVE

## 2021-08-20 LAB — LIPASE, BLOOD: Lipase: 44 U/L (ref 11–51)

## 2021-08-20 LAB — URINALYSIS, MICROSCOPIC (REFLEX)
RBC / HPF: NONE SEEN RBC/hpf (ref 0–5)
Squamous Epithelial / HPF: >50 (ref 0–5)

## 2021-08-20 NOTE — Discharge Instructions (Signed)
Please go to the ER to have more test that we cant do here today

## 2021-08-20 NOTE — ED Notes (Signed)
Patient is being discharged from the Urgent Care and sent to the Emergency Department via personal vehicle  . Per Nettie Elm Rodriguez-Southwest, patient is in need of higher level of care due to acute abdominal pain. Patient is aware and verbalizes understanding of plan of care.  Vitals:   08/20/21 1806  BP: (!) 111/57  Pulse: 87  Resp: 18  Temp: 98.9 F (37.2 C)  SpO2: 100%

## 2021-08-20 NOTE — ED Triage Notes (Signed)
Pt arrives with c/o lower ABD pain that started earlier today. Pt endorses nausea. Pt denies urinary symptoms.

## 2021-08-20 NOTE — ED Provider Notes (Signed)
Erroneous encounter - not my patient.   Rhys Martini, PA-C 09/02/21 1850

## 2021-08-20 NOTE — ED Triage Notes (Signed)
Pt presents with lower center abdominal pain that radiates to her groin area. She has nausea but denies vomiting.

## 2021-08-20 NOTE — ED Provider Notes (Signed)
[facility]    CSN: [id number] Arrival date & time: 08/20/21  1753      History   Chief Complaint Chief Complaint  Patient presents with   Abdominal Pain    HPI Sheila Kemp is a 36 y.o. female who presents with sudden lower abdominal pain since today. Pain is worse with walking. She denies UTI symptoms or abnormal vaginal discharge. Has been chilling today and has not been hungry. Denies constipation or diarrhea.     History reviewed. No pertinent past medical history.  Patient Active Problem List   Diagnosis Date Noted   Acute cholecystitis 03/21/2019    Past Surgical History:  Procedure Laterality Date   CHOLECYSTECTOMY N/A 03/22/2019   Procedure: LAPAROSCOPIC CHOLECYSTECTOMY;  Surgeon: Olean Ree, MD;  Location: [facility] ORS;  Service: General;  Laterality: N/A;   ECTOPIC PREGNANCY SURGERY     LASER ABLATION      OB History   No obstetric history on file.      Home Medications    Prior to Admission medications   Medication Sig Start Date End Date Taking? Authorizing Provider  cyclobenzaprine (FLEXERIL) 5 MG tablet Take 1 tablet (5 mg total) by mouth 3 (three) times daily as needed. 05/02/21   Menshew, Dannielle Karvonen, PA-C  diphenoxylate-atropine (LOMOTIL) 2.5-0.025 MG tablet Take 1 tablet by mouth 4 (four) times daily as needed for diarrhea or loose stools. 11/08/20   Coral Spikes, DO  fluticasone (FLONASE) 50 MCG/ACT nasal spray Place 2 sprays into both nostrils daily for 7 days. 06/16/19 08/09/20  Rodriguez-Southworth, Sunday Spillers, PA-C    Family History No family history on file.  Social History Social History   Tobacco Use   Smoking status: Former   Smokeless tobacco: Never  Scientific laboratory technician Use: Every day  Substance Use Topics   Alcohol use: Yes   Drug use: No     Allergies   Patient has no known allergies.   Review of Systems Review of Systems  Constitutional:  Positive for appetite change and chills. Negative for  fever.  Gastrointestinal:  Positive for abdominal pain. Negative for blood in stool, constipation, diarrhea, nausea and vomiting.  Skin:  Negative for rash and wound.    Physical Exam Triage Vital Signs ED Triage Vitals  Enc Vitals Group     BP 08/20/21 1806 (!) 111/57     Pulse Rate 08/20/21 1806 87     Resp 08/20/21 1806 18     Temp 08/20/21 1806 98.9 F (37.2 C)     Temp Source 08/20/21 1806 Oral     SpO2 08/20/21 1806 100 %     Weight --      Height --      Head Circumference --      Peak Flow --      Pain Score 08/20/21 1807 10     Pain Loc --      Pain Edu? --      Excl. in [place]? --    No data found.  Updated Vital Signs BP (!) 111/57 (BP Location: Left Arm)   Pulse 87   Temp 98.9 F (37.2 C) (Oral)   Resp 18   LMP 08/10/2021   SpO2 100%   Visual Acuity Right Eye Distance:   Left Eye Distance:   Bilateral Distance:    Right Eye Near:   Left Eye Near:    Bilateral Near:     Physical Exam Vitals and nursing note reviewed.  Constitutional:      General: She is in acute distress.     Appearance: She is obese. She is not toxic-appearing.     Comments: She is in moderate pain  HENT:     Head: Normocephalic.  Pulmonary:     Effort: Pulmonary effort is normal.  Abdominal:     General: Abdomen is protuberant. A surgical scar is present. Bowel sounds are decreased.     Palpations: Abdomen is soft. There is no mass.     Tenderness: There is abdominal tenderness in the right lower quadrant, suprapubic area and left lower quadrant. There is guarding and rebound.  Skin:    General: Skin is warm and dry.     Findings: No rash.  Neurological:     Mental Status: She is alert and oriented to person, place, and time.  Psychiatric:        Mood and Affect: Mood normal.        Behavior: Behavior normal.     UC Treatments / Results  Labs (all labs ordered are listed, but only abnormal results are displayed) Labs Reviewed  URINALYSIS, ROUTINE W REFLEX MICROSCOPIC  - Abnormal; Notable for the following components:      Result Value   APPearance HAZY (*)    Specific Gravity, Urine >1.030 (*)    Bilirubin Urine SMALL (*)    Ketones, ur 15 (*)    Protein, ur 30 (*)    All other components within normal limits  URINALYSIS, MICROSCOPIC (REFLEX) - Abnormal; Notable for the following components:   Bacteria, UA FEW (*)    All other components within normal limits  PREGNANCY, URINE    EKG   Radiology No results found.  Procedures Procedures (including critical care time)  Medications Ordered in UC Medications - No data to display  Initial Impression / Assessment and Plan / UC Course  I have reviewed the triage vital signs and the nursing notes.  Pertinent labs  results that were available during my care of the patient were reviewed by me and considered in my medical decision making (see chart for details).  She has acute abdominal pain with guarding and rebound  She was sent to ER for further work up Final Clinical Impressions(s) / UC Diagnoses   Final diagnoses:  Acute abdominal pain     Discharge Instructions      Please go to the ER to have more test that we cant do here today    ED Prescriptions   None    PDMP not reviewed this encounter.   Shelby Mattocks, [place] 08/20/21 1912

## 2021-08-21 ENCOUNTER — Emergency Department: Payer: Self-pay

## 2021-08-21 ENCOUNTER — Telehealth: Payer: Self-pay | Admitting: Obstetrics and Gynecology

## 2021-08-21 ENCOUNTER — Emergency Department
Admission: EM | Admit: 2021-08-21 | Discharge: 2021-08-21 | Disposition: A | Discharge status: Home / Self Care | Payer: Self-pay | Attending: Emergency Medicine | Admitting: Emergency Medicine

## 2021-08-21 DIAGNOSIS — R1030 Lower abdominal pain, unspecified: Secondary | ICD-10-CM

## 2021-08-21 DIAGNOSIS — N83202 Unspecified ovarian cyst, left side: Secondary | ICD-10-CM

## 2021-08-21 LAB — URINALYSIS, ROUTINE W REFLEX MICROSCOPIC
Bilirubin Urine: NEGATIVE
Glucose, UA: NEGATIVE mg/dL
Hgb urine dipstick: NEGATIVE
Ketones, ur: 5 mg/dL — AB
Leukocytes,Ua: NEGATIVE
Nitrite: NEGATIVE
Protein, ur: 30 mg/dL — AB
Specific Gravity, Urine: 1.033 — ABNORMAL HIGH (ref 1.005–1.030)
pH: 5 (ref 5.0–8.0)

## 2021-08-21 LAB — POC URINE PREG, ED: Preg Test, Ur: NEGATIVE

## 2021-08-21 MED ORDER — ONDANSETRON HCL 4 MG/2ML IJ SOLN
4.0000 mg | Freq: Once | INTRAMUSCULAR | Status: AC
  Administered 2021-08-21: 4 mg via INTRAVENOUS
  Filled 2021-08-21: qty 2

## 2021-08-21 MED ORDER — LACTATED RINGERS IV BOLUS
1000.0000 mL | Freq: Once | INTRAVENOUS | Status: AC
  Administered 2021-08-21: 1000 mL via INTRAVENOUS

## 2021-08-21 MED ORDER — IOHEXOL 350 MG/ML SOLN
100.0000 mL | Freq: Once | INTRAVENOUS | Status: AC | PRN
  Administered 2021-08-21: 100 mL via INTRAVENOUS

## 2021-08-21 MED ORDER — MORPHINE SULFATE (PF) 4 MG/ML IV SOLN
4.0000 mg | Freq: Once | INTRAVENOUS | Status: AC
  Administered 2021-08-21: 4 mg via INTRAVENOUS
  Filled 2021-08-21: qty 1

## 2021-08-21 NOTE — ED Provider Notes (Signed)
[facility] Provider Note    Event Date/Time   First MD Initiated Contact with Patient 08/21/21 [phone]     (approximate)   History   Chief Complaint Abdominal Pain   HPI  Sheila Kemp is a 36 y.o. female with past medical history of ectopic pregnancy and cholecystectomy who presents to the ED complaining of abdominal pain.  Patient reports that she has been dealing with intermittent pain in her pelvic area for the past 12 hours.  She describes it as a sharp pain that is exacerbated when she goes to stand or walk.  She states it feels similar to contractions and similar to when she was diagnosed with an ectopic pregnancy as a teenager.  She denies any associated vaginal bleeding or discharge, does state that she has had some pain when urinating.  She denies any nausea, vomiting, or changes in bowel movements.  She was evaluated at urgent care initially, where pregnancy testing was negative and urinalysis was unremarkable, was subsequently referred to the ED for further evaluation.     Physical Exam   Triage Vital Signs: ED Triage Vitals  Enc Vitals Group     BP 08/20/21 2001 121/72     Pulse Rate 08/20/21 2001 82     Resp 08/20/21 2001 17     Temp 08/20/21 2001 98.8 F (37.1 C)     Temp Source 08/20/21 2001 Oral     SpO2 08/20/21 2001 100 %     Weight 08/20/21 2001 228 lb (103.4 kg)     Height --      Head Circumference --      Peak Flow --      Pain Score 08/20/21 2001 8     Pain Loc --      Pain Edu? --      Excl. in [place]? --     Most recent vital signs: Vitals:   08/21/21 0120 08/21/21 0121  BP: (!) 103/55   Pulse: 78 78  Resp: 18   Temp:    SpO2: 100% 100%    Constitutional: Alert and oriented. Eyes: Conjunctivae are normal. Head: Atraumatic. Nose: No congestion/rhinnorhea. Mouth/Throat: Mucous membranes are moist.  Cardiovascular: Normal rate, regular rhythm. Grossly normal heart sounds.  2+ radial pulses  bilaterally. Respiratory: Normal respiratory effort.  No retractions. Lungs CTAB. Gastrointestinal: Soft and tender to palpation in the suprapubic area with voluntary guarding.  No CVA tenderness bilaterally. No distention. Musculoskeletal: No lower extremity tenderness nor edema.  Neurologic:  Normal speech and language. No gross focal neurologic deficits are appreciated.    ED Results / Procedures / Treatments   Labs (all labs ordered are listed, but only abnormal results are displayed) Labs Reviewed  COMPREHENSIVE METABOLIC PANEL - Abnormal; Notable for the following components:      Result Value   Alkaline Phosphatase 36 (*)    Total Bilirubin 1.4 (*)    All other components within normal limits  CBC - Abnormal; Notable for the following components:   WBC 16.1 (*)    All other components within normal limits  URINALYSIS, ROUTINE W REFLEX MICROSCOPIC - Abnormal; Notable for the following components:   Color, Urine YELLOW (*)    APPearance CLOUDY (*)    Specific Gravity, Urine 1.033 (*)    Ketones, ur 5 (*)    Protein, ur 30 (*)    Bacteria, UA RARE (*)    All other components within normal limits  LIPASE, BLOOD  POC  URINE PREG, ED    RADIOLOGY CT of abdomen/pelvis reviewed and interpreted by me with no inflammatory changes, dilated bowel loops, or focal fluid collections.  PROCEDURES:  Critical Care performed: No  Procedures   MEDICATIONS ORDERED IN ED: Medications  morphine (PF) 4 MG/ML injection 4 mg (4 mg Intravenous Given 08/21/21 0126)  ondansetron (ZOFRAN) injection 4 mg (4 mg Intravenous Given 08/21/21 0125)  lactated ringers bolus 1,000 mL (1,000 mLs Intravenous New Bag/Given 08/21/21 0128)  iohexol (OMNIPAQUE) 350 MG/ML injection 100 mL (100 mLs Intravenous Contrast Given 08/21/21 0240)     IMPRESSION / MDM / ASSESSMENT AND PLAN / ED COURSE  I reviewed the triage vital signs and the nursing notes.                              36 y.o. female with past  medical history of ectopic pregnancy and cholecystectomy who presents to the ED complaining of intermittent suprapubic pain for the past 12 hours.  Differential diagnosis includes, but is not limited to, ovarian torsion, uterine fibroids, endometriosis, UTI, appendicitis, diverticulitis.  Patient uncomfortable appearing but in no acute distress, vital signs are unremarkable.  She does have tenderness in her suprapubic area, pregnancy testing and urinalysis from urgent care were reviewed when pregnancy testing was negative and UA does not appear consistent with infection.  Labs thus far remarkable for leukocytosis, BMP without electrolyte abnormality or AKI, LFTs and lipase are within normal limits.  We will treat symptomatically with IV morphine and Zofran, further assess with ultrasound for ovarian torsion or fibroids.  Plan to check CT of abdomen/pelvis if ultrasound is unremarkable.  Pelvic ultrasound shows hemorrhagic left ovarian cyst with no evidence of torsion, no findings of the right ovary.  Patient's pain seems to be primarily be on the right lower part of her abdomen, CT scan was performed and negative for appendicitis or other explanation of patient's pain.  Symptoms seem related to left-sided ovarian cyst and she is appropriate for discharge home with OB/GYN follow-up.  She was counseled to return to the ED for new or worsening symptoms, patient agrees with plan.      FINAL CLINICAL IMPRESSION(S) / ED DIAGNOSES   Final diagnoses:  Lower abdominal pain  Cyst of left ovary     Rx / DC Orders   ED Discharge Orders     None        Note:  This document was prepared using Dragon voice recognition software and may include unintentional dictation errors.   Chesley Noon, MD 08/21/21 [phone]

## 2021-08-21 NOTE — Telephone Encounter (Signed)
Pt called stating that she can not get in with [facility] - she said she was told to call back to get worked in with [facility]- please advise on scheduling.

## 2021-11-19 ENCOUNTER — Ambulatory Visit
Admission: EM | Admit: 2021-11-19 | Discharge: 2021-11-19 | Disposition: A | Discharge status: Home / Self Care | Payer: 59 | Attending: Physician Assistant | Admitting: Physician Assistant

## 2021-11-19 DIAGNOSIS — J029 Acute pharyngitis, unspecified: Secondary | ICD-10-CM | POA: Diagnosis present

## 2021-11-19 DIAGNOSIS — Z20822 Contact with and (suspected) exposure to covid-19: Secondary | ICD-10-CM | POA: Insufficient documentation

## 2021-11-19 DIAGNOSIS — R509 Fever, unspecified: Secondary | ICD-10-CM | POA: Diagnosis present

## 2021-11-19 LAB — GROUP A STREP BY PCR: Group A Strep by PCR: NOT DETECTED

## 2021-11-19 LAB — SARS CORONAVIRUS 2 BY RT PCR: SARS Coronavirus 2 by RT PCR: POSITIVE — AB

## 2021-11-19 NOTE — ED Provider Notes (Signed)
[facility]    CSN: [id number] Arrival date & time: 11/19/21  1157      History   Chief Complaint Chief Complaint  Patient presents with   Sore Throat   Fever   Headache    HPI Sheila Kemp is a 36 y.o. female presenting for 2-day history of fever up to 101 degrees, headaches, body aches, chills, sore throat, very mild cough and slight runny nose.  Patient also reports throat swelling.  She has not been taking any over-the-counter medication for her symptoms.  She denies any sick contacts.  She is otherwise healthy.  No other complaints.  HPI  History reviewed. No pertinent past medical history.  Patient Active Problem List   Diagnosis Date Noted   Acute cholecystitis 03/21/2019    Past Surgical History:  Procedure Laterality Date   CHOLECYSTECTOMY N/A 03/22/2019   Procedure: LAPAROSCOPIC CHOLECYSTECTOMY;  Surgeon: Henrene Dodge, MD;  Location: [facility] ORS;  Service: General;  Laterality: N/A;   ECTOPIC PREGNANCY SURGERY     LASER ABLATION      OB History   No obstetric history on file.      Home Medications    Prior to Admission medications   Medication Sig Start Date End Date Taking? Authorizing Provider  cyclobenzaprine (FLEXERIL) 5 MG tablet Take 1 tablet (5 mg total) by mouth 3 (three) times daily as needed. 05/02/21  Yes Menshew, Charlesetta Ivory, PA-C  diphenoxylate-atropine (LOMOTIL) 2.5-0.025 MG tablet Take 1 tablet by mouth 4 (four) times daily as needed for diarrhea or loose stools. 11/08/20  Yes Cook, Jayce G, DO  fluticasone (FLONASE) 50 MCG/ACT nasal spray Place 2 sprays into both nostrils daily for 7 days. 06/16/19 08/09/20  Rodriguez-Southworth, Nettie Elm, PA-C    Family History History reviewed. No pertinent family history.  Social History Social History   Tobacco Use   Smoking status: Former   Smokeless tobacco: Never  Building services engineer Use: Every day  Substance Use Topics   Alcohol use: Yes   Drug use: No      Allergies   Patient has no known allergies.   Review of Systems Review of Systems  Constitutional:  Positive for chills, fatigue and fever. Negative for diaphoresis.  HENT:  Positive for rhinorrhea and sore throat. Negative for congestion, ear pain, sinus pressure and sinus pain.   Respiratory:  Positive for cough. Negative for shortness of breath.   Gastrointestinal:  Negative for abdominal pain, nausea and vomiting.  Musculoskeletal:  Positive for myalgias. Negative for arthralgias.  Skin:  Negative for rash.  Neurological:  Positive for headaches. Negative for weakness.  Hematological:  Negative for adenopathy.     Physical Exam Triage Vital Signs ED Triage Vitals  Enc Vitals Group     BP      Pulse      Resp      Temp      Temp src      SpO2      Weight      Height      Head Circumference      Peak Flow      Pain Score      Pain Loc      Pain Edu?      Excl. in [place]?    No data found.  Updated Vital Signs BP 93/60 (BP Location: Left Arm)   Pulse (!) 21   Temp 98.9 F (37.2 C) (Oral)   Resp 18   Ht  5\' 10"  (1.778 m)   Wt 230 lb (104.3 kg)   LMP 10/25/2021   SpO2 100%   BMI 33.00 kg/m   Physical Exam Vitals and nursing note reviewed.  Constitutional:      General: She is not in acute distress.    Appearance: Normal appearance. She is well-developed. She is ill-appearing. She is not toxic-appearing.  HENT:     Head: Normocephalic and atraumatic.     Nose: Congestion present.     Mouth/Throat:     Mouth: Mucous membranes are moist.     Pharynx: Oropharynx is clear. Posterior oropharyngeal erythema present.     Tonsils: No tonsillar exudate. 1+ on the right. 2+ on the left.  Eyes:     General: No scleral icterus.       Right eye: No discharge.        Left eye: No discharge.     Conjunctiva/sclera: Conjunctivae normal.  Cardiovascular:     Rate and Rhythm: Normal rate and regular rhythm.     Heart sounds: Normal heart sounds.  Pulmonary:      Effort: Pulmonary effort is normal. No respiratory distress.     Breath sounds: Normal breath sounds.  Musculoskeletal:     Cervical back: Neck supple.  Lymphadenopathy:     Cervical: Cervical adenopathy present.  Skin:    General: Skin is dry.  Neurological:     General: No focal deficit present.     Mental Status: She is alert. Mental status is at baseline.     Motor: No weakness.     Gait: Gait normal.  Psychiatric:        Mood and Affect: Mood normal.        Behavior: Behavior normal.        Thought Content: Thought content normal.      UC Treatments / Results  Labs (all labs ordered are listed, but only abnormal results are displayed) Labs Reviewed  SARS CORONAVIRUS 2 BY RT PCR - Abnormal; Notable for the following components:      Result Value   SARS Coronavirus 2 by RT PCR POSITIVE (*)    All other components within normal limits  GROUP A STREP BY PCR    EKG   Radiology No results found.  Procedures Procedures (including critical care time)  Medications Ordered in UC Medications - No data to display  Initial Impression / Assessment and Plan / UC Course  I have reviewed the triage vital signs and the nursing notes.  Pertinent labs & imaging results that were available during my care of the patient were reviewed by me and considered in my medical decision making (see chart for details).   36 year old female presents for fever, fatigue, body aches, chills, sore throat, slight cough and slight nasal drainage for the past few days.  Vitals are stable.  She is ill-appearing, wrapped up in a blanket from home.  She is nontoxic.  On exam she does have erythema posterior pharynx with 2+ enlarged tonsil on the left and 1+ enlarged on the right, enlarged bilateral anterior cervical lymph nodes and nasal congestion.  Chest clear auscultation heart regular rate and rhythm.  PCR strep test obtained.  Negative.  PCR COVID test obtained.  Reviewed results of strep test  with patient.    Advised patient I will contact her with the COVID test results which will be back later today.  Advised if negative COVID test, suspect possible false negative on the strep test and  will consider antibiotic treatment.  Supportive care encouraged with increasing rest and fluids.  Reviewed return and ER precautions.  Positive COVID.  Reviewed current CDC guidelines, isolation protocol and ED precautions relating to COVID-19.  Patient does not really have any risk factors for severe disease so we will hold off on antiviral medication at this time.  Encouraged use of over-the-counter decongestants, antipyretics and encouraged her to rest increase fluids as well as try Chloraseptic spray and throat lozenges.  Work note provided.  Final Clinical Impressions(s) / UC Diagnoses   Final diagnoses:  Pharyngitis, unspecified etiology  Fever, unspecified  Encounter for screening laboratory testing for COVID-19 virus     Discharge Instructions      -The strep test was negative. - We are testing for COVID.  If COVID is positive I can send antiviral medicine to the pharmacy for you. - You need to isolate 5 days and wear a mask for 5 days.  If the COVID is negative, then you may have had a false negative on strep test and I can send antibiotics to the pharmacy for you. - Increase your rest and fluids. - Follow-up as needed.     ED Prescriptions   None    PDMP not reviewed this encounter.   Shirlee Latch, PA-C 11/19/21 1624

## 2021-11-19 NOTE — ED Triage Notes (Signed)
Pt c/o headache, body chills, sore throat, temperature of 101.3 x2days.

## 2021-11-19 NOTE — Discharge Instructions (Signed)
-  The strep test was negative. - We are testing for COVID.  If COVID is positive I can send antiviral medicine to the pharmacy for you. - You need to isolate 5 days and wear a mask for 5 days.  If the COVID is negative, then you may have had a false negative on strep test and I can send antibiotics to the pharmacy for you. - Increase your rest and fluids. - Follow-up as needed.

## 2022-05-07 ENCOUNTER — Other Ambulatory Visit: Payer: Self-pay | Admitting: Obstetrics and Gynecology

## 2022-05-23 DIAGNOSIS — N840 Polyp of corpus uteri: Secondary | ICD-10-CM

## 2022-05-23 HISTORY — DX: Polyp of corpus uteri: N84.0
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
## 2022-06-05 ENCOUNTER — Encounter
Admission: RE | Admit: 2022-06-05 | Discharge: 2022-06-05 | Disposition: A | Discharge status: Home / Self Care | Payer: PRIVATE HEALTH INSURANCE | Source: Ambulatory Visit | Attending: Obstetrics and Gynecology | Admitting: Obstetrics and Gynecology

## 2022-06-05 VITALS — Ht 70.0 in | Wt 235.0 lb

## 2022-06-05 DIAGNOSIS — Z01812 Encounter for preprocedural laboratory examination: Secondary | ICD-10-CM

## 2022-06-05 HISTORY — DX: Personal history of urinary calculi: Z87.442

## 2022-06-05 HISTORY — DX: Personal history of other diseases of the circulatory system: Z86.79

## 2022-06-05 HISTORY — DX: Unspecified ovarian cyst, left side: N83.202

## 2022-06-05 NOTE — Patient Instructions (Addendum)
Your procedure is scheduled on: Friday, March 22 Report to the Registration Desk on the 1st floor of the [facility]. To find out your arrival time, please call [phone] between 1PM - 3PM on: Thursday, March 21 If your arrival time is 6:00 am, do not arrive before that time as the [place] entrance doors do not open until 6:00 am.  REMEMBER: Instructions that are not followed completely may result in serious medical risk, up to and including death; or upon the discretion of your surgeon and anesthesiologist your surgery may need to be rescheduled.  Do not eat food after midnight the night before surgery.  No gum chewing or hard candies.  You may however, drink CLEAR liquids up to 2 hours before you are scheduled to arrive for your surgery. Do not drink anything within 2 hours of your scheduled arrival time.  Clear liquids include: - water  - apple juice without pulp - gatorade (not RED colors) - black coffee or tea (Do NOT add milk or creamers to the coffee or tea) Do NOT drink anything that is not on this list.  In addition, your doctor has ordered for you to drink the provided:  Ensure Pre-Surgery Clear Carbohydrate Drink  Drinking this carbohydrate drink up to two hours before surgery helps to reduce insulin resistance and improve patient outcomes. Please complete drinking 2 hours before scheduled arrival time.  One week prior to surgery: starting March 15 Stop Anti-inflammatories (NSAIDS) such as Advil, Aleve, Ibuprofen, Motrin, Naproxen, Naprosyn and Aspirin based products such as Excedrin, Goody's Powder, BC Powder. Stop ANY OVER THE COUNTER supplements until after surgery. You may however, continue to take Tylenol if needed for pain up until the day of surgery.  DON NOT TAKE MEDICATIONS THE MORNING OF SURGERY   No Alcohol for 24 hours before or after surgery.  No Smoking including e-cigarettes for 24 hours before surgery.  No chewable tobacco products for at least  6 hours before surgery.  No nicotine patches on the day of surgery.  Do not use any "recreational" drugs for at least a week (preferably 2 weeks) before your surgery.  Please be advised that the combination of cocaine and anesthesia may have negative outcomes, up to and including death. If you test positive for cocaine, your surgery will be cancelled.  On the morning of surgery brush your teeth with toothpaste and water, you may rinse your mouth with mouthwash if you wish. Do not swallow any toothpaste or mouthwash.  Do not wear jewelry, make-up, hairpins, clips or nail polish.  Do not wear lotions, powders, or perfumes.   Do not shave body hair from the neck down 48 hours before surgery.  Contact lenses, hearing aids and dentures may not be worn into surgery.  Do not bring valuables to the hospital. [facility] is not responsible for any missing/lost belongings or valuables.   Notify your doctor if there is any change in your medical condition (cold, fever, infection).  Wear comfortable clothing (specific to your surgery type) to the hospital.  After surgery, you can help prevent lung complications by doing breathing exercises.  Take deep breaths and cough every 1-2 hours. Your doctor may order a device called an Incentive Spirometer to help you take deep breaths.  If you are being discharged the day of surgery, you will not be allowed to drive home. You will need a responsible individual to drive you home and stay with you for 24 hours after surgery.  If you are taking public transportation, you will need to have a responsible individual with you.  Please call the [facility]. at [phone] if you have any questions about these instructions.  Surgery Visitation Policy:  Patients undergoing a surgery or procedure may have two family members or support persons with them as long as the person is not COVID-19 positive or experiencing its symptoms.

## 2022-06-06 ENCOUNTER — Encounter
Admission: RE | Admit: 2022-06-06 | Discharge: 2022-06-06 | Disposition: A | Discharge status: Home / Self Care | Payer: 59 | Source: Ambulatory Visit | Attending: Obstetrics and Gynecology | Admitting: Obstetrics and Gynecology

## 2022-06-06 DIAGNOSIS — N840 Polyp of corpus uteri: Secondary | ICD-10-CM | POA: Diagnosis not present

## 2022-06-06 DIAGNOSIS — Z01812 Encounter for preprocedural laboratory examination: Secondary | ICD-10-CM | POA: Insufficient documentation

## 2022-06-06 LAB — TYPE AND SCREEN
ABO/RH(D): A POS
Antibody Screen: NEGATIVE

## 2022-06-13 ENCOUNTER — Ambulatory Visit: Payer: 59 | Admitting: Urgent Care

## 2022-06-13 ENCOUNTER — Ambulatory Visit
Admission: RE | Admit: 2022-06-13 | Discharge: 2022-06-13 | Disposition: A | Discharge status: Home / Self Care | Payer: 59 | Attending: Obstetrics and Gynecology | Admitting: Obstetrics and Gynecology

## 2022-06-13 ENCOUNTER — Ambulatory Visit: Payer: 59 | Admitting: Anesthesiology

## 2022-06-13 ENCOUNTER — Other Ambulatory Visit: Payer: Self-pay

## 2022-06-13 ENCOUNTER — Encounter
Admission: RE | Disposition: A | Discharge status: Home / Self Care | Payer: Self-pay | Source: Home / Self Care | Attending: Obstetrics and Gynecology

## 2022-06-13 ENCOUNTER — Encounter: Payer: Self-pay | Admitting: Obstetrics and Gynecology

## 2022-06-13 DIAGNOSIS — N840 Polyp of corpus uteri: Secondary | ICD-10-CM

## 2022-06-13 DIAGNOSIS — Z6833 Body mass index (BMI) 33.0-33.9, adult: Secondary | ICD-10-CM | POA: Insufficient documentation

## 2022-06-13 DIAGNOSIS — E669 Obesity, unspecified: Secondary | ICD-10-CM | POA: Diagnosis not present

## 2022-06-13 DIAGNOSIS — Z01812 Encounter for preprocedural laboratory examination: Secondary | ICD-10-CM

## 2022-06-13 DIAGNOSIS — Z87891 Personal history of nicotine dependence: Secondary | ICD-10-CM | POA: Insufficient documentation

## 2022-06-13 DIAGNOSIS — N946 Dysmenorrhea, unspecified: Secondary | ICD-10-CM

## 2022-06-13 DIAGNOSIS — N920 Excessive and frequent menstruation with regular cycle: Secondary | ICD-10-CM | POA: Diagnosis present

## 2022-06-13 HISTORY — PX: HYSTEROSCOPY WITH D & C: SHX1775

## 2022-06-13 LAB — POCT PREGNANCY, URINE: Preg Test, Ur: NEGATIVE

## 2022-06-13 LAB — ABO/RH: ABO/RH(D): A POS

## 2022-06-13 SURGERY — DILATATION AND CURETTAGE /HYSTEROSCOPY
Anesthesia: General

## 2022-06-13 MED ORDER — KETOROLAC TROMETHAMINE 30 MG/ML IJ SOLN
INTRAMUSCULAR | Status: DC | PRN
  Administered 2022-06-13: 30 mg via INTRAVENOUS

## 2022-06-13 MED ORDER — KETOROLAC TROMETHAMINE 30 MG/ML IJ SOLN
INTRAMUSCULAR | Status: AC
  Filled 2022-06-13: qty 1

## 2022-06-13 MED ORDER — MIDAZOLAM HCL 2 MG/2ML IJ SOLN
INTRAMUSCULAR | Status: AC
  Filled 2022-06-13: qty 2

## 2022-06-13 MED ORDER — 0.9 % SODIUM CHLORIDE (POUR BTL) OPTIME
TOPICAL | Status: DC | PRN
  Administered 2022-06-13: 500 mL
  Administered 2022-06-13: 1282 mL

## 2022-06-13 MED ORDER — GLYCOPYRROLATE 0.2 MG/ML IJ SOLN
INTRAMUSCULAR | Status: DC | PRN
  Administered 2022-06-13: .2 mg via INTRAVENOUS

## 2022-06-13 MED ORDER — LACTATED RINGERS IV SOLN: INTRAVENOUS | Status: DC

## 2022-06-13 MED ORDER — IBUPROFEN 600 MG PO TABS: 600.0000 mg | ORAL_TABLET | Freq: Four times a day (QID) | ORAL | 0 refills | Status: DC

## 2022-06-13 MED ORDER — OXYCODONE HCL 5 MG PO TABS: 5.0000 mg | ORAL_TABLET | Freq: Once | ORAL | Status: DC | PRN

## 2022-06-13 MED ORDER — OXYCODONE HCL 5 MG/5ML PO SOLN: 5.0000 mg | Freq: Once | ORAL | Status: DC | PRN

## 2022-06-13 MED ORDER — DEXAMETHASONE SODIUM PHOSPHATE 10 MG/ML IJ SOLN
INTRAMUSCULAR | Status: DC | PRN
  Administered 2022-06-13: 10 mg via INTRAVENOUS

## 2022-06-13 MED ORDER — FAMOTIDINE 20 MG PO TABS
ORAL_TABLET | ORAL | Status: AC
  Filled 2022-06-13: qty 1

## 2022-06-13 MED ORDER — PROPOFOL 10 MG/ML IV BOLUS
INTRAVENOUS | Status: DC | PRN
  Administered 2022-06-13: 200 mg via INTRAVENOUS

## 2022-06-13 MED ORDER — ONDANSETRON HCL 4 MG/2ML IJ SOLN: 4.0000 mg | Freq: Once | INTRAMUSCULAR | Status: DC | PRN

## 2022-06-13 MED ORDER — FAMOTIDINE 20 MG PO TABS
20.0000 mg | ORAL_TABLET | Freq: Once | ORAL | Status: AC
  Administered 2022-06-13: 20 mg via ORAL

## 2022-06-13 MED ORDER — ACETAMINOPHEN 10 MG/ML IV SOLN
INTRAVENOUS | Status: AC
  Filled 2022-06-13: qty 100

## 2022-06-13 MED ORDER — ONDANSETRON HCL 4 MG/2ML IJ SOLN
INTRAMUSCULAR | Status: DC | PRN
  Administered 2022-06-13: 4 mg via INTRAVENOUS

## 2022-06-13 MED ORDER — FENTANYL CITRATE (PF) 100 MCG/2ML IJ SOLN
INTRAMUSCULAR | Status: AC
  Filled 2022-06-13: qty 2

## 2022-06-13 MED ORDER — ORAL CARE MOUTH RINSE: 15.0000 mL | Freq: Once | OROMUCOSAL | Status: AC

## 2022-06-13 MED ORDER — CHLORHEXIDINE GLUCONATE 0.12 % MT SOLN
15.0000 mL | Freq: Once | OROMUCOSAL | Status: AC
  Administered 2022-06-13: 15 mL via OROMUCOSAL

## 2022-06-13 MED ORDER — ACETAMINOPHEN 10 MG/ML IV SOLN
1000.0000 mg | Freq: Once | INTRAVENOUS | Status: DC | PRN
  Administered 2022-06-13: 1000 mg via INTRAVENOUS

## 2022-06-13 MED ORDER — FENTANYL CITRATE (PF) 100 MCG/2ML IJ SOLN
25.0000 ug | INTRAMUSCULAR | Status: DC | PRN
  Administered 2022-06-13: 50 ug via INTRAVENOUS

## 2022-06-13 MED ORDER — SILVER NITRATE-POT NITRATE 75-25 % EX MISC
CUTANEOUS | Status: DC | PRN
  Administered 2022-06-13: 2 via TOPICAL

## 2022-06-13 MED ORDER — LIDOCAINE HCL (CARDIAC) PF 100 MG/5ML IV SOSY
PREFILLED_SYRINGE | INTRAVENOUS | Status: DC | PRN
  Administered 2022-06-13: 100 mg via INTRAVENOUS

## 2022-06-13 MED ORDER — FENTANYL CITRATE (PF) 100 MCG/2ML IJ SOLN
INTRAMUSCULAR | Status: DC | PRN
  Administered 2022-06-13 (×4): 25 ug via INTRAVENOUS

## 2022-06-13 MED ORDER — CHLORHEXIDINE GLUCONATE 0.12 % MT SOLN
OROMUCOSAL | Status: AC
  Filled 2022-06-13: qty 15

## 2022-06-13 MED ORDER — MIDAZOLAM HCL 2 MG/2ML IJ SOLN
INTRAMUSCULAR | Status: DC | PRN
  Administered 2022-06-13: 2 mg via INTRAVENOUS
  Administered 2022-06-13: 1 mg via INTRAVENOUS

## 2022-06-13 SURGICAL SUPPLY — 28 items
BAG DRN RND TRDRP ANRFLXCHMBR (UROLOGICAL SUPPLIES)
BAG URINE DRAIN 2000ML AR STRL (UROLOGICAL SUPPLIES) IMPLANT
CATH FOLEY 2WAY  5CC 16FR (CATHETERS)
CATH FOLEY 2WAY 5CC 16FR (CATHETERS)
CATH URTH 16FR FL 2W BLN LF (CATHETERS) IMPLANT
DEVICE MYOSURE LITE (MISCELLANEOUS) IMPLANT
DEVICE MYOSURE REACH (MISCELLANEOUS) IMPLANT
DRSG TELFA 3X8 NADH STRL (GAUZE/BANDAGES/DRESSINGS) IMPLANT
ELECT REM PT RETURN 9FT ADLT (ELECTROSURGICAL)
ELECTRODE REM PT RTRN 9FT ADLT (ELECTROSURGICAL) ×1 IMPLANT
GLOVE BIO SURGEON STRL SZ7 (GLOVE) ×1 IMPLANT
GLOVE BIOGEL PI IND STRL 7.5 (GLOVE) ×1 IMPLANT
GOWN STRL REUS W/ TWL LRG LVL3 (GOWN DISPOSABLE) ×2 IMPLANT
GOWN STRL REUS W/TWL LRG LVL3 (GOWN DISPOSABLE) ×2
IV NS IRRIG 3000ML ARTHROMATIC (IV SOLUTION) ×1 IMPLANT
KIT PROCEDURE FLUENT (KITS) ×1 IMPLANT
KIT TURNOVER CYSTO (KITS) ×1 IMPLANT
MANIFOLD NEPTUNE II (INSTRUMENTS) ×1 IMPLANT
PACK DNC HYST (MISCELLANEOUS) ×1 IMPLANT
PAD OB MATERNITY 4.3X12.25 (PERSONAL CARE ITEMS) ×1 IMPLANT
PAD PREP 24X41 OB/GYN DISP (PERSONAL CARE ITEMS) ×1 IMPLANT
SCRUB CHG 4% DYNA-HEX 4OZ (MISCELLANEOUS) ×1 IMPLANT
SEAL ROD LENS SCOPE MYOSURE (ABLATOR) ×1 IMPLANT
SET CYSTO W/LG BORE CLAMP LF (SET/KITS/TRAYS/PACK) IMPLANT
SURGILUBE 2OZ TUBE FLIPTOP (MISCELLANEOUS) ×1 IMPLANT
TRAP FLUID SMOKE EVACUATOR (MISCELLANEOUS) ×1 IMPLANT
TUBING CONNECTING 10 (TUBING) ×1 IMPLANT
WATER STERILE IRR 500ML POUR (IV SOLUTION) ×1 IMPLANT

## 2022-06-13 NOTE — Anesthesia Procedure Notes (Signed)
Procedure Name: LMA Insertion Date/Time: 06/13/2022 11:20 AM  Performed by: Demetrius Charity, CRNAPre-anesthesia Checklist: Patient identified, Patient being monitored, Timeout performed, Emergency Drugs available and Suction available Patient Re-evaluated:Patient Re-evaluated prior to induction Oxygen Delivery Method: Circle system utilized Preoxygenation: Pre-oxygenation with 100% oxygen Induction Type: IV induction Ventilation: Mask ventilation without difficulty LMA: LMA inserted LMA Size: 4.0 Tube type: Oral Number of attempts: 1 Placement Confirmation: positive ETCO2 and breath sounds checked- equal and bilateral Tube secured with: Tape Dental Injury: Teeth and Oropharynx as per pre-operative assessment

## 2022-06-13 NOTE — Transfer of Care (Signed)
Immediate Anesthesia Transfer of Care Note  Patient: Sheila Kemp  Procedure(s) Performed: DILATATION AND CURETTAGE /HYSTEROSCOPY, ENDOMETRIAL POLYPECTOMY  Patient Location: PACU  Anesthesia Type:General  Level of Consciousness: awake, alert , and oriented  Airway & Oxygen Therapy: Patient Spontanous Breathing and Patient connected to face mask oxygen  Post-op Assessment: Report given to RN and Post -op Vital signs reviewed and stable  Post vital signs: Reviewed and stable  Last Vitals:  Vitals Value Taken Time  BP 111/75 06/13/22 1206  Temp 36.1 C 06/13/22 1204  Pulse 50 06/13/22 1209  Resp 13 06/13/22 1209  SpO2 100 % 06/13/22 1209  Vitals shown include unvalidated device data.  Last Pain:  Vitals:   06/13/22 1204  TempSrc:   PainSc: Asleep         Complications: No notable events documented.

## 2022-06-13 NOTE — Op Note (Signed)
Operative Note   Name: Sheila Kemp  DOB: [birth year]  MRN: [id number]  Date of Service: 06/13/2022   PRE-OP DIAGNOSIS:  1) Menorrhagia with regular cycle 2) Endometrial polyp 3) Dysmenorrhea   POST-OP DIAGNOSIS:  1) Menorrhagia with regular cycle 2) Endometrial polyp 3) Dysmenorrhea   SURGEON: Surgeon(s) and Role:    Will Bonnet, MD - Primary  PROCEDURE: Procedure(s): DILATATION AND CURETTAGE /HYSTEROSCOPY, ENDOMETRIAL POLYPECTOMY   ANESTHESIA: General LMA  ESTIMATED BLOOD LOSS: 5 mL  DRAINS: none   TOTAL IV FLUIDS: 600 mL  SPECIMENS:  Endometrial curettings and endometrial polyp  VTE PROPHYLAXIS: SCDs to the bilateral lower extremities  ANTIBIOTICS: none indicated and none given  FLUID DEFICIT: 123XX123 mL  COMPLICATIONS: none  DISPOSITION: PACU - hemodynamically stable.  CONDITION: stable  INDICATION: 37 y.o. female with painful and heavy periods who was noted to have an endometrial polyp on pelvic ultrasound that appeared to be growing in size.  After counseling, she elected to go to the OR for removal of the polyp and general sampling of her uterus.   FINDINGS: Exam under anesthesia revealed small, mobile anteverted uterus with no masses and bilateral adnexa without masses or fullness. Hysteroscopy revealed a grossly normal appearing uterine cavity with bilateral tubal ostia and normal appearing endocervical canal. There was a broad-based polypoid lesion at the left  cornu that was about 1 cm in diameter.    PROCEDURE IN DETAIL:  After informed consent was obtained, the patient was taken to the operating room where anesthesia was obtained without difficulty. The patient was positioned in the dorsal lithotomy position in [place].  The patient's bladder was catheterized with an in and out foley catheter.  The patient was examined under anesthesia, with the above noted findings.  The bi-valved speculum was placed in the patient's vagina, and the  the anterior lip of the cervix was grasped with the single-tooth tenaculum.  Then the cervix was progressively dilated to a 15 Pratt dilator.  The hysteroscope was introduced, with the above noted findings.  The MyoSure Reach device was utilized to remove the polypoid lesion in the left cornu.    The hystersocope was removed and the uterine cavity was curetted until a gritty texture was noted, yielding moderate endometrial curettings.  The hysteroscope was re-introduced and the intrauterine pressure was lowered to a level that was well below the MAP and hemostasis was noted.  The hysteroscope was removed. The tenaculum was removed and the tenaculum entry sites were made hemostatic using silver nitrate.  After verifying that no sponges or instruments were left in the vagina, the speculum was removed.  She was then taken out of dorsal lithotomy.  The patient tolerated the procedure well.  Sponge, lap and needle counts were correct x2.  The patient was taken to recovery room in excellent condition.  Will Bonnet, MD, [place] 06/13/2022 11:58 AM

## 2022-06-13 NOTE — Anesthesia Postprocedure Evaluation (Signed)
Anesthesia Post Note  Patient: Sheila Kemp  Procedure(s) Performed: DILATATION AND CURETTAGE /HYSTEROSCOPY, ENDOMETRIAL POLYPECTOMY  Patient location during evaluation: PACU Anesthesia Type: General Level of consciousness: awake and alert, oriented and patient cooperative Pain management: pain level controlled Vital Signs Assessment: post-procedure vital signs reviewed and stable Respiratory status: spontaneous breathing, nonlabored ventilation and respiratory function stable Cardiovascular status: blood pressure returned to baseline and stable Postop Assessment: adequate PO intake Anesthetic complications: no   No notable events documented.   Last Vitals:  Vitals:   06/13/22 1230 06/13/22 1245  BP: 116/77 117/71  Pulse: (!) 52 62  Resp: 11 18  Temp:    SpO2: 100% 100%    Last Pain:  Vitals:   06/13/22 1245  TempSrc:   PainSc: Asleep                 Darrin Nipper

## 2022-06-13 NOTE — Discharge Instructions (Signed)

## 2022-06-13 NOTE — Anesthesia Preprocedure Evaluation (Addendum)
Anesthesia Evaluation  Patient identified by MRN, date of birth, ID band Patient awake    Reviewed: Allergy & Precautions, NPO status , Patient's Chart, lab work & pertinent test results  History of Anesthesia Complications Negative for: history of anesthetic complications  Airway Mallampati: I   Neck ROM: Full    Dental  (+) Missing   Pulmonary former smoker (quit 2022; current vaping)   Pulmonary exam normal breath sounds clear to auscultation       Cardiovascular Exercise Tolerance: Good negative cardio ROS Normal cardiovascular exam Rhythm:Regular Rate:Normal     Neuro/Psych negative neurological ROS     GI/Hepatic negative GI ROS,,,  Endo/Other  Obesity   Renal/GU Renal disease (nephrolithiasis)     Musculoskeletal   Abdominal   Peds  Hematology negative hematology ROS (+)   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  Reproductive/Obstetrics                             Anesthesia Physical Anesthesia Plan  ASA: 2  Anesthesia Plan: General   Post-op Pain Management:    Induction: Intravenous  PONV Risk Score and Plan: 3 and Ondansetron, Dexamethasone and Treatment may vary due to age or medical condition  Airway Management Planned: LMA  Additional Equipment:   Intra-op Plan:   Post-operative Plan: Extubation in OR  Informed Consent: I have reviewed the patients History and Physical, chart, labs and discussed the procedure including the risks, benefits and alternatives for the proposed anesthesia with the patient or authorized representative who has indicated his/her understanding and acceptance.     Dental advisory given  Plan Discussed with: CRNA  Anesthesia Plan Comments: (Patient consented for risks of anesthesia including but not limited to:  - adverse reactions to medications - damage to eyes, teeth, lips or other oral mucosa - nerve damage due to  positioning  - sore throat or hoarseness - damage to heart, brain, nerves, lungs, other parts of body or loss of life  Informed patient about role of CRNA in peri- and intra-operative care.  Patient voiced understanding.)        Anesthesia Quick Evaluation

## 2022-06-13 NOTE — Interval H&P Note (Signed)
History and Physical Interval Note:  06/13/2022 10:58 AM  Sheila Kemp  has presented today for surgery, with the diagnosis of endometrial polyp, dysmenorrhea.  The various methods of treatment have been discussed with the patient and family. After consideration of risks, benefits and other options for treatment, the patient has consented to  Procedure(s): DILATATION AND CURETTAGE /HYSTEROSCOPY, ENDOMETRIAL POLYPECTOMY (N/A) as a surgical intervention.  The patient's history has been reviewed, patient examined, no change in status, stable for surgery.  I have reviewed the patient's chart and labs.  Questions were answered to the patient's satisfaction.  Consents reviewed and patient wishes to proceed.  Prentice Docker, MD, [facility] 06/13/2022 10:59 AM

## 2022-06-14 ENCOUNTER — Encounter: Payer: Self-pay | Admitting: Obstetrics and Gynecology

## 2022-06-16 LAB — SURGICAL PATHOLOGY

## 2023-04-20 ENCOUNTER — Ambulatory Visit
Admission: EM | Admit: 2023-04-20 | Discharge: 2023-04-20 | Disposition: A | Discharge status: Home / Self Care | Payer: 59 | Attending: Emergency Medicine | Admitting: Emergency Medicine

## 2023-04-20 ENCOUNTER — Encounter: Payer: Self-pay | Admitting: Emergency Medicine

## 2023-04-20 DIAGNOSIS — Z20822 Contact with and (suspected) exposure to covid-19: Secondary | ICD-10-CM | POA: Diagnosis present

## 2023-04-20 DIAGNOSIS — J029 Acute pharyngitis, unspecified: Secondary | ICD-10-CM | POA: Diagnosis present

## 2023-04-20 LAB — GROUP A STREP BY PCR: Group A Strep by PCR: NOT DETECTED

## 2023-04-20 LAB — RESP PANEL BY RT-PCR (RSV, FLU A&B, COVID)  RVPGX2
Influenza A by PCR: NEGATIVE
Influenza B by PCR: NEGATIVE
Resp Syncytial Virus by PCR: NEGATIVE
SARS Coronavirus 2 by RT PCR: NEGATIVE

## 2023-04-20 MED ORDER — FLUTICASONE PROPIONATE 50 MCG/ACT NA SUSP: 2.0000 | Freq: Every day | NASAL | 0 refills | Status: AC

## 2023-04-20 MED ORDER — IBUPROFEN 600 MG PO TABS: 600.0000 mg | ORAL_TABLET | Freq: Three times a day (TID) | ORAL | 0 refills | Status: AC | PRN

## 2023-04-20 NOTE — ED Provider Notes (Signed)
HPI  SUBJECTIVE:  Sheila Kemp is a 38 y.o. female who presents with sore throat, body aches, nasal congestion, rhinorrhea, sinus pain and pressure with blowing only, postnasal drip, swollen cervical lymphadenopathy, dry cough starting yesterday.  No fevers, facial swelling, upper dental pain, wheezing or shortness of breath, nausea, vomiting, diarrhea, abdominal pain, rash.  No drooling, trismus, neck stiffness, sensation of throat swelling shut, difficulty breathing or swallowing.  No known COVID, flu, strep exposure.  She did not get the COVID or flu vaccines.  No antibiotics in the past month.  No antipyretic in the past 6 hours.  She tried DayQuil without improvement in her symptoms.  There are no aggravating or alleviating factors.  She has no past medical history.  LMP: 1/14 through 1/18.  Denies possibility of being pregnant.  PCP: Her OB/GYN/fertility clinic.  Past Medical History:  Diagnosis Date   Ectopic pregnancy 2005   Endometrial polyp 05/2022   History of kidney stones    History of right bundle branch block (RBBB)    Left ovarian cyst    Symptomatic cholelithiasis 02/2019    Past Surgical History:  Procedure Laterality Date   CHOLECYSTECTOMY N/A 03/22/2019   Procedure: LAPAROSCOPIC CHOLECYSTECTOMY;  Surgeon: Henrene Dodge, MD;  Location: [facility] ORS;  Service: General;  Laterality: N/A;   ECTOPIC PREGNANCY SURGERY  2005   HYSTEROSCOPY WITH D & C N/A 06/13/2022   Procedure: DILATATION AND CURETTAGE /HYSTEROSCOPY, ENDOMETRIAL POLYPECTOMY;  Surgeon: Conard Novak, MD;  Location: [facility] ORS;  Service: Gynecology;  Laterality: N/A;   LASER ABLATION      No family history on file.  Social History   Tobacco Use   Smoking status: Former    Types: Cigarettes   Smokeless tobacco: Never  Vaping Use   Vaping status: Every Day   Substances: Nicotine, Flavoring  Substance Use Topics   Alcohol use: Yes    Comment: social   Drug use: No    No current  facility-administered medications for this encounter.  Current Outpatient Medications:    fluticasone (FLONASE) 50 MCG/ACT nasal spray, Place 2 sprays into both nostrils daily., Disp: 16 g, Rfl: 0   ibuprofen (ADVIL) 600 MG tablet, Take 1 tablet (600 mg total) by mouth every 8 (eight) hours as needed., Disp: 30 tablet, Rfl: 0  No Known Allergies   ROS  As noted in HPI.   Physical Exam  BP 124/70 (BP Location: Left Arm)   Pulse 77   Temp 99.9 F (37.7 C) (Oral)   Resp 18   LMP 04/11/2023   SpO2 96%   Constitutional: Well developed, well nourished, no acute distress Eyes:  EOMI, conjunctiva normal bilaterally HENT: Normocephalic, atraumatic,mucus membranes moist positive nasal.  Erythematous, swollen turbinates.  No maxillary, frontal sinus tenderness.  Intensely erythematous oropharynx, enlarged tonsils, bigger on the left.  No exudates.  Uvula midline.  No soft palate swelling.  No drooling, trismus, neck stiffness, muffled voice. Neck: Positive cervical lymphadenopathy Respiratory: Normal inspiratory effort, lungs clear bilaterally Cardiovascular: Normal rate, regular rhythm, no murmurs rubs gallops GI: nondistended soft, nontender, no splenomegaly skin: No rash, skin intact Musculoskeletal: no deformities Neurologic: Alert & oriented x 3, no focal neuro deficits Psychiatric: Speech and behavior appropriate   ED Course   Medications - No data to display  Orders Placed This Encounter  Procedures   Group A Strep by PCR    Standing Status:   Standing    Number of Occurrences:   1   Resp  panel by RT-PCR (RSV, Flu A&B, Covid) Anterior Nasal Swab    Standing Status:   Standing    Number of Occurrences:   1   Airborne and Contact precautions    Standing Status:   Standing    Number of Occurrences:   1    Results for orders placed or performed during the hospital encounter of 04/20/23 (from the past 24 hours)  Group A Strep by PCR     Status: None   Collection Time:  04/20/23  4:26 PM   Specimen: Throat; Sterile Swab  Result Value Ref Range   Group A Strep by PCR NOT DETECTED NOT DETECTED  Resp panel by RT-PCR (RSV, Flu A&B, Covid) Anterior Nasal Swab     Status: None   Collection Time: 04/20/23  5:38 PM   Specimen: Anterior Nasal Swab  Result Value Ref Range   SARS Coronavirus 2 by RT PCR NEGATIVE NEGATIVE   Influenza A by PCR NEGATIVE NEGATIVE   Influenza B by PCR NEGATIVE NEGATIVE   Resp Syncytial Virus by PCR NEGATIVE NEGATIVE   No results found.  ED Clinical Impression  1. Pharyngitis, unspecified etiology   2. Encounter for laboratory testing for COVID-19 virus      ED Assessment/Plan     Strep PCR negative.  Will contact patient at [phone] if flu or COVID are positive.  There is no evidence of peritonsillar abscess today.  In the meantime, Benadryl/Maalox mixture, Mucinex D, saline nasal irrigation, Tylenol/ibuprofen together 3 times a day, Flonase.  Work note for several days.  May return to work when afebrile for 24 hours without the use of antipyretics.  Strict ER return precautions given.  Discussed labs,  MDM, treatment plan, and plan for follow-up with patient. Discussed sn/sx that should prompt return to the ED. patient agrees with plan.   Meds ordered this encounter  Medications   fluticasone (FLONASE) 50 MCG/ACT nasal spray    Sig: Place 2 sprays into both nostrils daily.    Dispense:  16 g    Refill:  0   ibuprofen (ADVIL) 600 MG tablet    Sig: Take 1 tablet (600 mg total) by mouth every 8 (eight) hours as needed.    Dispense:  30 tablet    Refill:  0      *This clinic note was created using Scientist, clinical (histocompatibility and immunogenetics). Therefore, there may be occasional mistakes despite careful proofreading.  ?    Domenick Gong, MD 04/22/23 1721

## 2023-04-20 NOTE — ED Triage Notes (Signed)
Patient presents with c/o non productive cough, sore throat, generalized body aches, and nasal congestion x 1 day.

## 2023-04-20 NOTE — Discharge Instructions (Signed)
Your strep PCR testing was negative.  Your COVID, flu are pending.  We will contact you if and only if either one of them are positive.  1 gram of Tylenol and 600 mg ibuprofen together 3-4 times a day as needed for pain.  Make sure you drink plenty of extra fluids.  Some people find salt water gargles and  Traditional Medicinal's "Throat Coat" tea helpful. Take 5 mL of liquid Benadryl and 5 mL of Maalox. Mix it together, and then hold it in your mouth for as long as you can and then swallow. You may do this 4 times a day.  Honey and lemon dissolved in hot water can also be soothing.  Mucinex D, saline nasal irrigation with a Lloyd Huger Med rinse and distilled water as often as you want, Flonase for the nasal congestion.  Here is a list of primary care providers who are taking new patients:  [facility] [place] Dr. Joseph Berkshire (sports medicine) Dr. Elizabeth Sauer [address] [place] [address] [phone]  [facility] [address] [place], [place] [address] [phone]  [facility] [place] [address]  [place] [address]  [phone]  [facility] [address] [place], [place] [address] [phone]  [facility] [address]  [phone] [place], [place] [address]  Go to www.goodrx.com  or www.[email or web address] to look up your medications. This will give you a list of where you can find your prescriptions at the most affordable prices. Or ask the pharmacist what the cash price is, or if they have any other discount programs available to help make your medication more affordable. This can be less expensive than what you would pay with insurance.

## 2023-07-30 ENCOUNTER — Emergency Department
Admission: EM | Admit: 2023-07-30 | Discharge: 2023-07-30 | Disposition: A | Discharge status: Home / Self Care | Attending: Emergency Medicine | Admitting: Emergency Medicine

## 2023-07-30 ENCOUNTER — Other Ambulatory Visit: Payer: Self-pay

## 2023-07-30 ENCOUNTER — Emergency Department

## 2023-07-30 DIAGNOSIS — K219 Gastro-esophageal reflux disease without esophagitis: Secondary | ICD-10-CM | POA: Diagnosis not present

## 2023-07-30 DIAGNOSIS — R1013 Epigastric pain: Secondary | ICD-10-CM

## 2023-07-30 DIAGNOSIS — R1010 Upper abdominal pain, unspecified: Secondary | ICD-10-CM | POA: Diagnosis present

## 2023-07-30 LAB — HEPATIC FUNCTION PANEL
ALT: 15 U/L (ref 0–44)
AST: 18 U/L (ref 15–41)
Albumin: 3.4 g/dL — ABNORMAL LOW (ref 3.5–5.0)
Alkaline Phosphatase: 29 U/L — ABNORMAL LOW (ref 38–126)
Bilirubin, Direct: <0.1 mg/dL (ref 0.0–0.2)
Total Bilirubin: 1 mg/dL (ref 0.0–1.2)
Total Protein: 6.7 g/dL (ref 6.5–8.1)

## 2023-07-30 LAB — CBC
HCT: 37.5 % (ref 36.0–46.0)
Hemoglobin: 12.3 g/dL (ref 12.0–15.0)
MCH: 29.4 pg (ref 26.0–34.0)
MCHC: 32.8 g/dL (ref 30.0–36.0)
MCV: 89.5 fL (ref 80.0–100.0)
Platelets: 257 10*3/uL (ref 150–400)
RBC: 4.19 MIL/uL (ref 3.87–5.11)
RDW: 12.6 % (ref 11.5–15.5)
WBC: 5.5 10*3/uL (ref 4.0–10.5)
nRBC: 0 % (ref 0.0–0.2)

## 2023-07-30 LAB — BASIC METABOLIC PANEL WITH GFR
Anion gap: 8 (ref 5–15)
BUN: 13 mg/dL (ref 6–20)
CO2: 23 mmol/L (ref 22–32)
Calcium: 9 mg/dL (ref 8.9–10.3)
Chloride: 106 mmol/L (ref 98–111)
Creatinine, Ser: 0.79 mg/dL (ref 0.44–1.00)
GFR, Estimated: >60 mL/min (ref >60)
Glucose, Bld: 101 mg/dL — ABNORMAL HIGH (ref 70–99)
Potassium: 4 mmol/L (ref 3.5–5.1)
Sodium: 137 mmol/L (ref 135–145)

## 2023-07-30 LAB — TROPONIN I (HIGH SENSITIVITY): Troponin I (High Sensitivity): <2 ng/L (ref <18)

## 2023-07-30 LAB — LIPASE, BLOOD: Lipase: 31 U/L (ref 11–51)

## 2023-07-30 MED ORDER — SUCRALFATE 1 G PO TABS
1.0000 g | ORAL_TABLET | Freq: Four times a day (QID) | ORAL | 1 refills | Status: AC
Start: 2023-07-30 — End: ?

## 2023-07-30 MED ORDER — METOCLOPRAMIDE HCL 10 MG PO TABS: 10.0000 mg | ORAL_TABLET | Freq: Four times a day (QID) | ORAL | 0 refills | Status: AC | PRN

## 2023-07-30 MED ORDER — METOCLOPRAMIDE HCL 10 MG PO TABS
10.0000 mg | ORAL_TABLET | Freq: Once | ORAL | Status: AC
  Administered 2023-07-30: 10 mg via ORAL
  Filled 2023-07-30: qty 1

## 2023-07-30 MED ORDER — FAMOTIDINE 20 MG PO TABS: 20.0000 mg | ORAL_TABLET | Freq: Two times a day (BID) | ORAL | 0 refills | Status: AC

## 2023-07-30 MED ORDER — ALUM & MAG HYDROXIDE-SIMETH 200-200-20 MG/5ML PO SUSP
30.0000 mL | Freq: Once | ORAL | Status: AC
  Administered 2023-07-30: 30 mL via ORAL
  Filled 2023-07-30: qty 30

## 2023-07-30 MED ORDER — FAMOTIDINE 20 MG PO TABS
40.0000 mg | ORAL_TABLET | Freq: Once | ORAL | Status: AC
  Administered 2023-07-30: 40 mg via ORAL
  Filled 2023-07-30: qty 2

## 2023-07-30 NOTE — ED Notes (Signed)
 States she is feeling better  Taking PO fluids well

## 2023-07-30 NOTE — ED Provider Notes (Signed)
 [facility] Provider Note    Event Date/Time   First MD Initiated Contact with Patient 07/30/23 1156     (approximate)   History   Chief Complaint: Chest Pain   HPI  Sheila Kemp is a 38 y.o. female with a history of kidney stones, ovarian cyst, cholelithiasis who comes ED complaining of left upper abdominal pain that radiates into the left shoulder, intermittent since last night.  Not exertional, not pleuritic.  No shortness of breath diaphoresis or vomiting.  No aggravating or alleviating factors.  Feels like pressure or cramping.  Last about 20 minutes at a time.        Past Medical History:  Diagnosis Date   Ectopic pregnancy 2005   Endometrial polyp 05/2022   History of kidney stones    History of right bundle branch block (RBBB)    Left ovarian cyst    Symptomatic cholelithiasis 02/2019    Current Outpatient Rx   Order #: [id number] Class: Normal   Order #: [id number] Class: Normal   Order #: [id number] Class: Normal   Order #: [id number] Class: Normal   Order #: [id number] Class: Normal    Past Surgical History:  Procedure Laterality Date   CHOLECYSTECTOMY N/A 03/22/2019   Procedure: LAPAROSCOPIC CHOLECYSTECTOMY;  Surgeon: Emmalene Hare, MD;  Location: [facility] ORS;  Service: General;  Laterality: N/A;   ECTOPIC PREGNANCY SURGERY  2005   HYSTEROSCOPY WITH D & C N/A 06/13/2022   Procedure: DILATATION AND CURETTAGE /HYSTEROSCOPY, ENDOMETRIAL POLYPECTOMY;  Surgeon: Kris Pester, MD;  Location: [facility] ORS;  Service: Gynecology;  Laterality: N/A;   LASER ABLATION      Physical Exam   Triage Vital Signs: ED Triage Vitals  Encounter Vitals Group     BP 07/30/23 1132 110/77     Systolic BP Percentile --      Diastolic BP Percentile --      Pulse Rate 07/30/23 1132 82     Resp 07/30/23 1132 20     Temp 07/30/23 1132 98.4 F (36.9 C)     Temp Source 07/30/23 1132 Oral     SpO2 07/30/23 1132 100 %     Weight 07/30/23 1130 242 lb  (109.8 kg)     Height 07/30/23 1130 5\' 10"  (1.778 m)     Head Circumference --      Peak Flow --      Pain Score 07/30/23 1130 7     Pain Loc --      Pain Education --      Exclude from Growth Chart --     Most recent vital signs: Vitals:   07/30/23 1132  BP: 110/77  Pulse: 82  Resp: 20  Temp: 98.4 F (36.9 C)  SpO2: 100%    General: Awake, no distress.  CV:  Good peripheral perfusion.  Regular rate rhythm Resp:  Normal effort.  Clear to auscultation bilaterally Abd:  No distention.  Soft with epigastric tenderness reproducing the pain. Other:  Chest wall nontender   ED Results / Procedures / Treatments   Labs (all labs ordered are listed, but only abnormal results are displayed) Labs Reviewed  BASIC METABOLIC PANEL WITH GFR - Abnormal; Notable for the following components:      Result Value   Glucose, Bld 101 (*)    All other components within normal limits  HEPATIC FUNCTION PANEL - Abnormal; Notable for the following components:   Albumin 3.4 (*)    Alkaline Phosphatase 29 (*)    All  other components within normal limits  CBC  LIPASE, BLOOD  POC URINE PREG, ED  TROPONIN I (HIGH SENSITIVITY)     EKG Interpreted by me Sinus rhythm rate of 65.  Normal axis and intervals.  Right bundle branch block.  No acute ischemic changes.   RADIOLOGY Chest x-ray interpreted by me, appears normal.  Radiology report reviewed   PROCEDURES:  Procedures   MEDICATIONS ORDERED IN ED: Medications  alum & mag hydroxide-simeth (MAALOX/MYLANTA) 200-200-20 MG/5ML suspension 30 mL (30 mLs Oral Given 07/30/23 1319)  famotidine  (PEPCID ) tablet 40 mg (40 mg Oral Given 07/30/23 1319)  metoCLOPramide (REGLAN) tablet 10 mg (10 mg Oral Given 07/30/23 1319)     IMPRESSION / MDM / ASSESSMENT AND PLAN / ED COURSE  I reviewed the triage vital signs and the nursing notes.  DDx: Gastritis, pancreatitis, biliary disease, dehydration, AKI, electrolyte derangement.  Patient's presentation  is most consistent with acute presentation with potential threat to life or bodily function.  Patient presents with upper abdominal pain, has epigastric tenderness.  Vital signs EKG chest x-ray unremarkable.  Doubt ACS PE dissection AAA pneumothorax or pericardial effusion.  Will add on LFTs, give antacids.   ----------------------------------------- 3:09 PM on 07/30/2023 ----------------------------------------- Labs normal.  Feeling better and tolerating oral intake.  Stable for discharge.      FINAL CLINICAL IMPRESSION(S) / ED DIAGNOSES   Final diagnoses:  Gastroesophageal reflux disease without esophagitis  Epigastric pain     Rx / DC Orders   ED Discharge Orders          Ordered    sucralfate (CARAFATE) 1 g tablet  4 times daily        07/30/23 1509    famotidine  (PEPCID ) 20 MG tablet  2 times daily        07/30/23 1509    metoCLOPramide (REGLAN) 10 MG tablet  Every 6 hours PRN        07/30/23 1509             Note:  This document was prepared using Dragon voice recognition software and may include unintentional dictation errors.   Jacquie Maudlin, MD 07/30/23 [phone]

## 2023-07-30 NOTE — ED Notes (Signed)
 See triage note  States she developed some chest pain  with some dizziness and SOB  States this episode lasted about 20 mins and occurred while standing still

## 2023-07-30 NOTE — ED Triage Notes (Signed)
 Patient states chest pain, shortness of breath and dizziness since yesterday.

## 2024-03-31 IMAGING — CT CT ABD-PELV W/ CM
2 of 4 series · 17 of 46 positions shown, 19 images · IV contrast (APPLIED)
Comparison: None Available.

CLINICAL DATA: Right lower quadrant pain

EXAM:
CT ABDOMEN AND PELVIS WITH CONTRAST
TECHNIQUE: Multidetector CT imaging of the abdomen and pelvis was performed
using the standard protocol following bolus administration of
intravenous contrast.

[Series 2: abdomen 5.0 · axial · 0.79mm/px · z∈[-960,-515]mm · 14 of 103 slices shown, 16 images]
[im 7/103  soft-tissue]
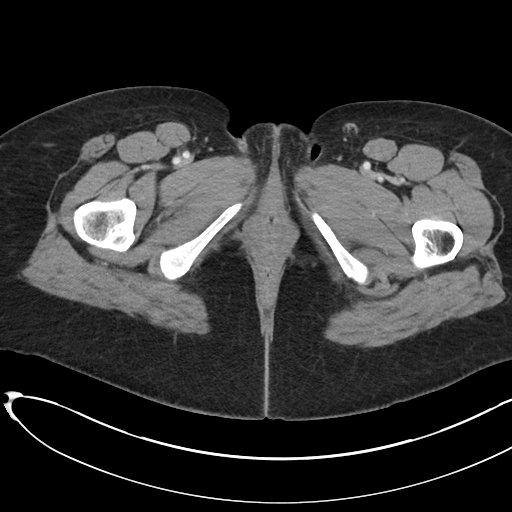
[im 7/103  bone]
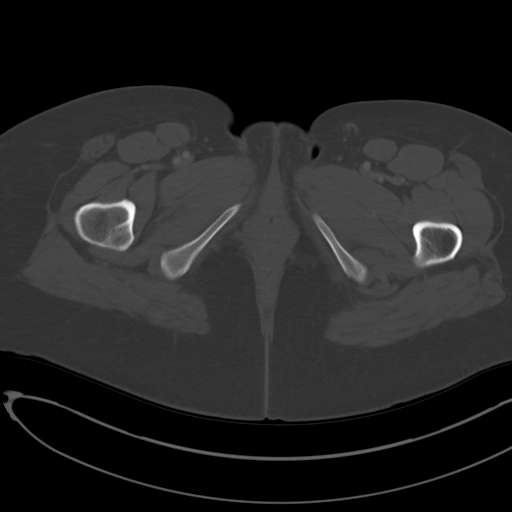
[im 13/103  soft-tissue]
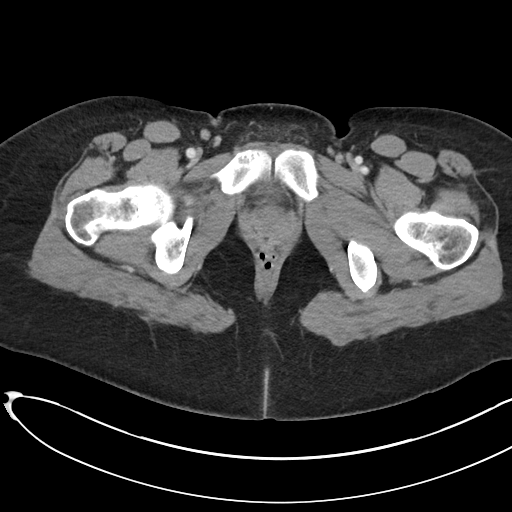
[im 20/103  soft-tissue]
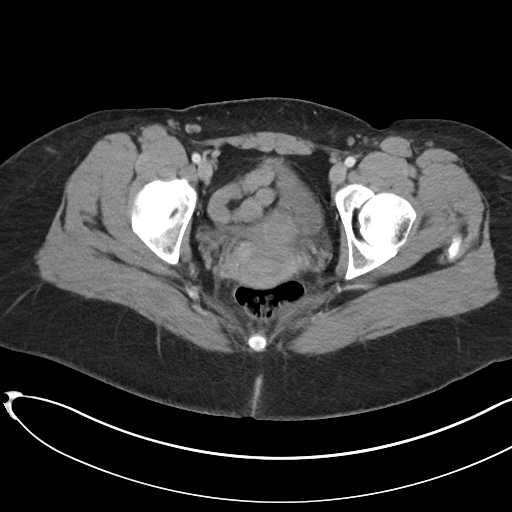
[im 26/103  soft-tissue]
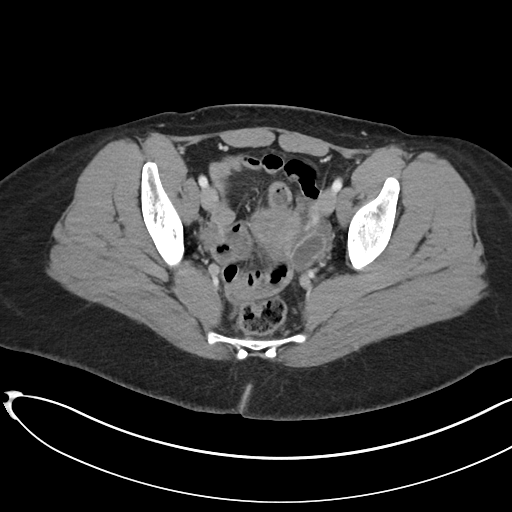
[im 32/103  soft-tissue]
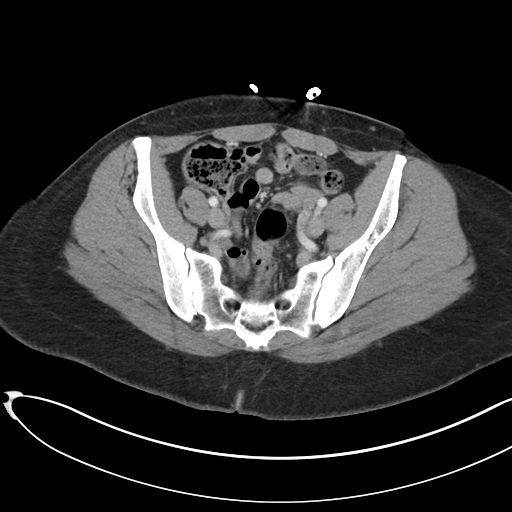
[im 39/103  soft-tissue]
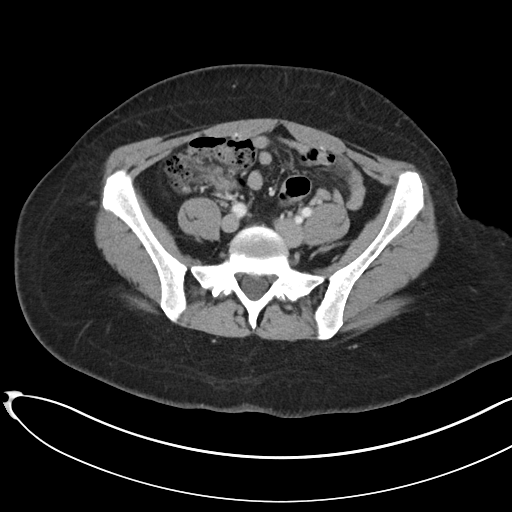
[im 45/103  soft-tissue]
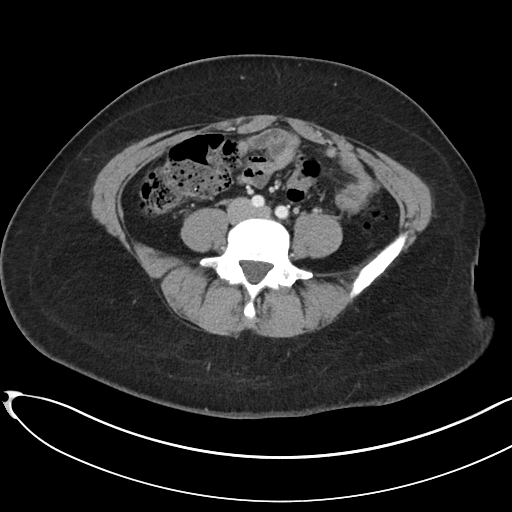
[im 58/103  soft-tissue]
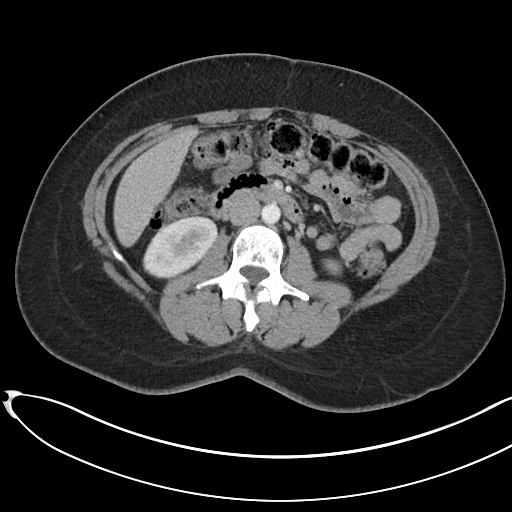
[im 64/103  soft-tissue]
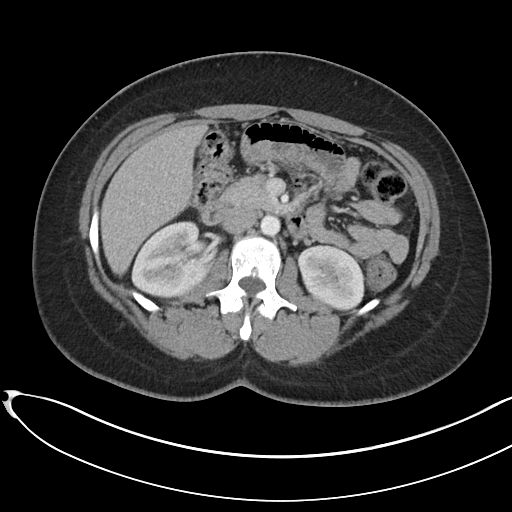
[im 64/103  bone]
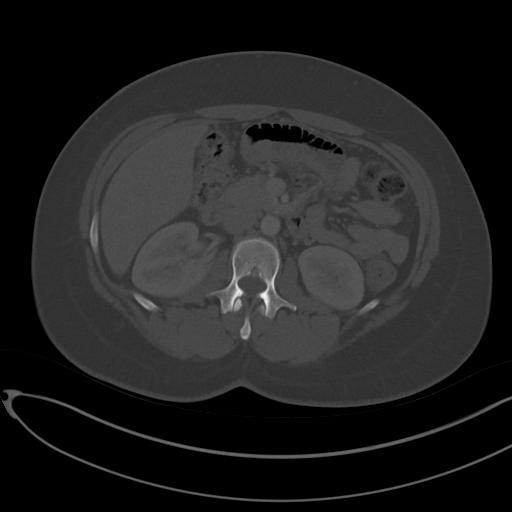
[im 71/103  soft-tissue]
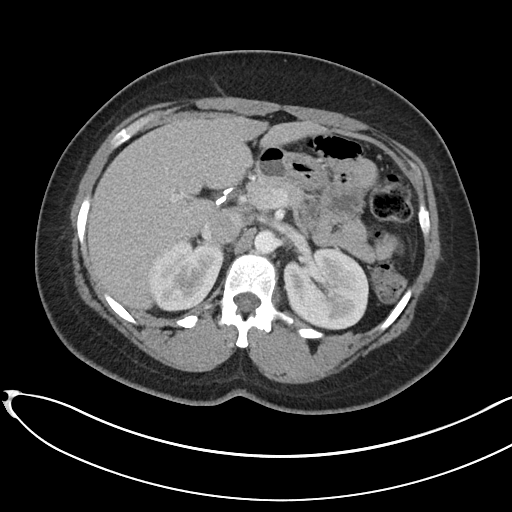
[im 77/103  soft-tissue]
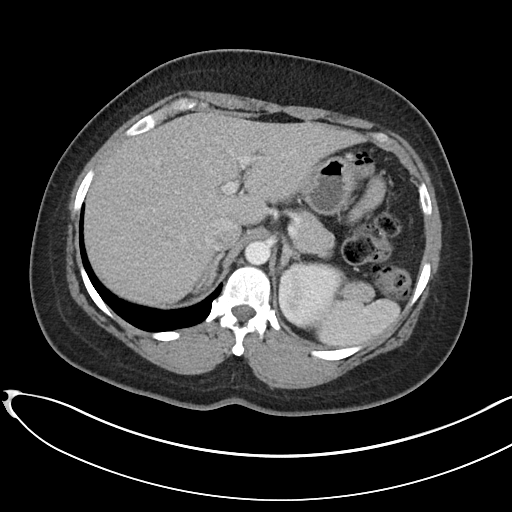
[im 83/103  soft-tissue]
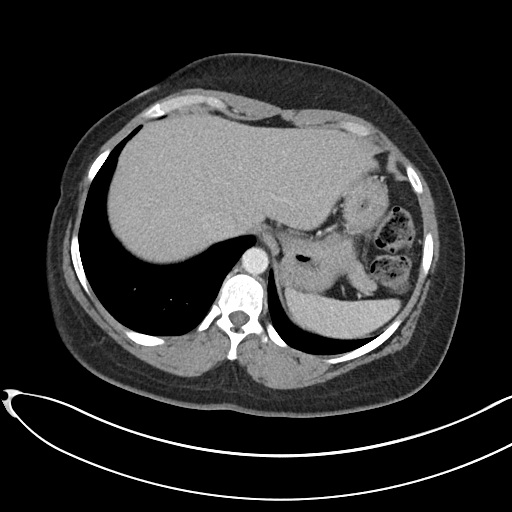
[im 90/103  soft-tissue]
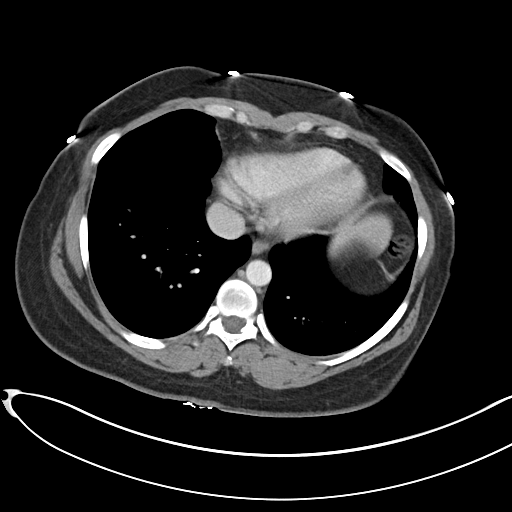
[im 96/103  soft-tissue]
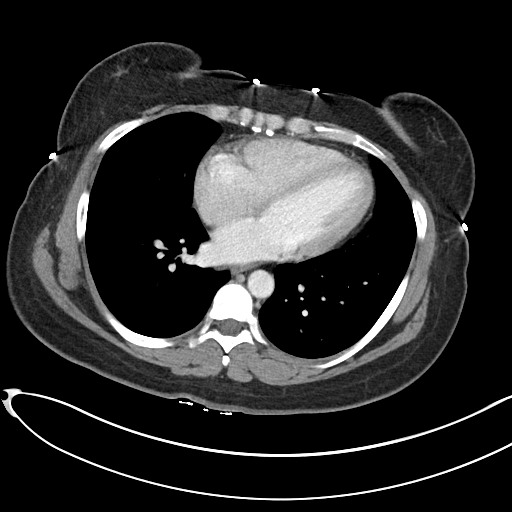

[Series 5: abdomen 3.0 mpr cor · coronal · 0.97mm/px · 3 of 81 slices shown]
[im 27/81  soft-tissue]
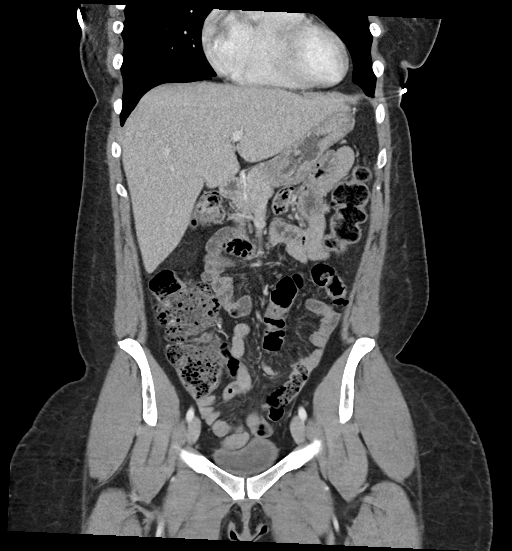
[im 36/81  soft-tissue]
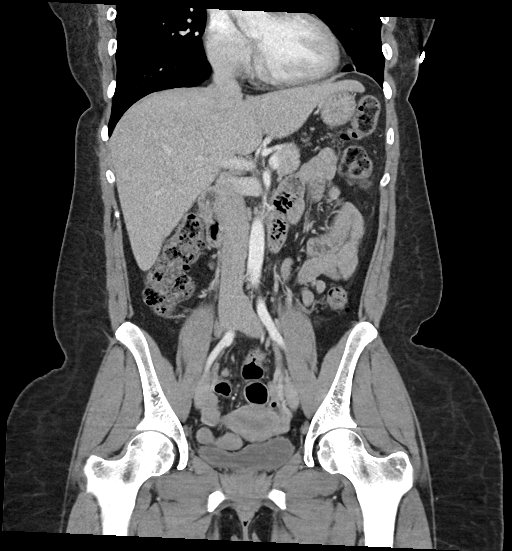
[im 45/81  soft-tissue]
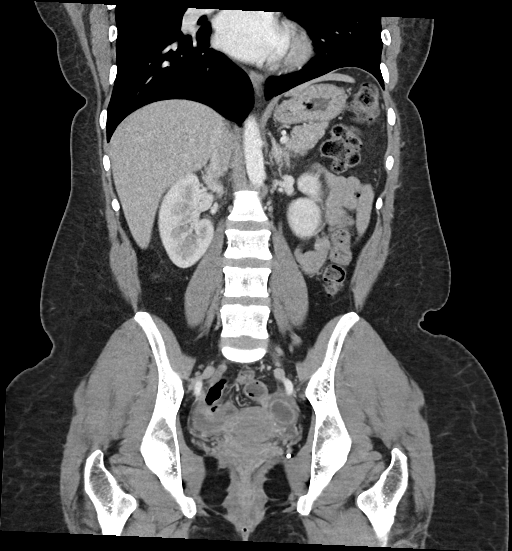

[17 of 46 positions shown; findings below may reference images not displayed]

RADIATION DOSE REDUCTION: This exam was performed according to the
departmental dose-optimization program which includes automated
exposure control, adjustment of the mA and/or kV according to
patient size and/or use of iterative reconstruction technique.

CONTRAST:  100mL OMNIPAQUE IOHEXOL 350 MG/ML SOLN
FINDINGS: Lower chest: Lung bases are clear. No effusions. Heart is normal
size.

Hepatobiliary: No focal liver abnormality is seen. Status post
cholecystectomy. No biliary dilatation.

Pancreas: No focal abnormality or ductal dilatation.

Spleen: No focal abnormality.  Normal size.

Adrenals/Urinary Tract: No adrenal abnormality. No focal renal
abnormality. No stones or hydronephrosis. Urinary bladder is
unremarkable.

Stomach/Bowel: Normal appendix. Stomach, large and small bowel
grossly unremarkable.

Vascular/Lymphatic: No evidence of aneurysm or adenopathy.

Reproductive: 3 cm left ovarian cyst. Uterus and right adnexa
unremarkable.

Other: Trace free fluid in the pelvis.  No free air.

Musculoskeletal: No acute bony abnormality.
IMPRESSION: Normal appendix.

3 cm left ovarian cyst. No follow-up imaging recommended. Note: This
recommendation does not apply to premenarchal patients and to those
with increased risk (genetic, family history, elevated tumor markers
or other high-risk factors) of ovarian cancer. Reference: JACR [id number]

No acute findings.
